# Patient Record
Sex: Male | Born: 1940 | Race: White | Hispanic: No | Marital: Married | Smoking: Former smoker
Health system: Southern US, Community
[De-identification: ages and names within clinical notes are randomized; demographics above are authoritative.]

## PROBLEM LIST (undated history)

## (undated) DIAGNOSIS — K219 Gastro-esophageal reflux disease without esophagitis: Secondary | ICD-10-CM

## (undated) DIAGNOSIS — I10 Essential (primary) hypertension: Secondary | ICD-10-CM

## (undated) DIAGNOSIS — J45909 Unspecified asthma, uncomplicated: Secondary | ICD-10-CM

---

## 1997-10-02 ENCOUNTER — Ambulatory Visit (HOSPITAL_COMMUNITY): Admission: RE | Admit: 1997-10-02 | Discharge: 1997-10-02 | Payer: Self-pay | Admitting: Urology

## 2013-01-30 ENCOUNTER — Emergency Department (HOSPITAL_COMMUNITY): Payer: Medicare Other

## 2013-01-30 ENCOUNTER — Inpatient Hospital Stay (HOSPITAL_COMMUNITY)
Admission: EM | Admit: 2013-01-30 | Discharge: 2013-02-02 | DRG: 086 | Disposition: A | Discharge status: Home / Self Care | Payer: Medicare Other | Attending: General Surgery | Admitting: General Surgery

## 2013-01-30 ENCOUNTER — Encounter (HOSPITAL_COMMUNITY): Payer: Self-pay | Admitting: *Deleted

## 2013-01-30 DIAGNOSIS — I609 Nontraumatic subarachnoid hemorrhage, unspecified: Secondary | ICD-10-CM

## 2013-01-30 DIAGNOSIS — K219 Gastro-esophageal reflux disease without esophagitis: Secondary | ICD-10-CM | POA: Diagnosis present

## 2013-01-30 DIAGNOSIS — R404 Transient alteration of awareness: Secondary | ICD-10-CM | POA: Diagnosis present

## 2013-01-30 DIAGNOSIS — S066X1A Traumatic subarachnoid hemorrhage with loss of consciousness of 30 minutes or less, initial encounter: Secondary | ICD-10-CM

## 2013-01-30 DIAGNOSIS — IMO0002 Reserved for concepts with insufficient information to code with codable children: Secondary | ICD-10-CM | POA: Diagnosis present

## 2013-01-30 DIAGNOSIS — J45909 Unspecified asthma, uncomplicated: Secondary | ICD-10-CM | POA: Diagnosis present

## 2013-01-30 DIAGNOSIS — I10 Essential (primary) hypertension: Secondary | ICD-10-CM | POA: Diagnosis present

## 2013-01-30 DIAGNOSIS — W11XXXA Fall on and from ladder, initial encounter: Secondary | ICD-10-CM

## 2013-01-30 DIAGNOSIS — S066X9A Traumatic subarachnoid hemorrhage with loss of consciousness of unspecified duration, initial encounter: Principal | ICD-10-CM | POA: Diagnosis present

## 2013-01-30 DIAGNOSIS — S2249XA Multiple fractures of ribs, unspecified side, initial encounter for closed fracture: Secondary | ICD-10-CM

## 2013-01-30 DIAGNOSIS — D62 Acute posthemorrhagic anemia: Secondary | ICD-10-CM | POA: Diagnosis not present

## 2013-01-30 DIAGNOSIS — S066XAA Traumatic subarachnoid hemorrhage with loss of consciousness status unknown, initial encounter: Secondary | ICD-10-CM

## 2013-01-30 HISTORY — DX: Essential (primary) hypertension: I10

## 2013-01-30 HISTORY — DX: Gastro-esophageal reflux disease without esophagitis: K21.9

## 2013-01-30 HISTORY — DX: Unspecified asthma, uncomplicated: J45.909

## 2013-01-30 LAB — URINALYSIS, ROUTINE W REFLEX MICROSCOPIC
Glucose, UA: NEGATIVE mg/dL
Leukocytes, UA: NEGATIVE
Nitrite: NEGATIVE
Specific Gravity, Urine: 1.044 — ABNORMAL HIGH (ref 1.005–1.030)
pH: 7.5 (ref 5.0–8.0)

## 2013-01-30 LAB — COMPREHENSIVE METABOLIC PANEL
AST: 37 U/L (ref 0–37)
Albumin: 3.6 g/dL (ref 3.5–5.2)
Chloride: 106 mEq/L (ref 96–112)
Creatinine, Ser: 1.12 mg/dL (ref 0.50–1.35)
Total Bilirubin: 0.3 mg/dL (ref 0.3–1.2)

## 2013-01-30 LAB — CBC WITH DIFFERENTIAL/PLATELET
Lymphocytes Relative: 27 % (ref 12–46)
Lymphs Abs: 2 10*3/uL (ref 0.7–4.0)
MCV: 94.9 fL (ref 78.0–100.0)
Neutro Abs: 4.5 10*3/uL (ref 1.7–7.7)
Neutrophils Relative %: 61 % (ref 43–77)
Platelets: 182 10*3/uL (ref 150–400)
RBC: 4.11 MIL/uL — ABNORMAL LOW (ref 4.22–5.81)
WBC: 7.4 10*3/uL (ref 4.0–10.5)

## 2013-01-30 LAB — LIPASE, BLOOD: Lipase: 57 U/L (ref 11–59)

## 2013-01-30 LAB — SAMPLE TO BLOOD BANK

## 2013-01-30 LAB — MAGNESIUM: Magnesium: 2 mg/dL (ref 1.5–2.5)

## 2013-01-30 LAB — PROTIME-INR: INR: 0.97 (ref 0.00–1.49)

## 2013-01-30 MED ORDER — IPRATROPIUM BROMIDE 0.02 % IN SOLN: 0.5000 mg | RESPIRATORY_TRACT | Status: DC | PRN

## 2013-01-30 MED ORDER — ATENOLOL 50 MG PO TABS
50.0000 mg | ORAL_TABLET | Freq: Every day | ORAL | Status: DC
  Administered 2013-01-31 – 2013-02-02 (×3): 50 mg via ORAL
  Filled 2013-01-30 (×3): qty 1

## 2013-01-30 MED ORDER — IPRATROPIUM BROMIDE 0.02 % IN SOLN
0.5000 mg | Freq: Four times a day (QID) | RESPIRATORY_TRACT | Status: DC
  Filled 2013-01-30: qty 2.5

## 2013-01-30 MED ORDER — DOCUSATE SODIUM 100 MG PO CAPS
100.0000 mg | ORAL_CAPSULE | Freq: Two times a day (BID) | ORAL | Status: DC
  Administered 2013-01-31 – 2013-02-02 (×4): 100 mg via ORAL
  Filled 2013-01-30 (×7): qty 1

## 2013-01-30 MED ORDER — ONDANSETRON HCL 4 MG PO TABS
4.0000 mg | ORAL_TABLET | Freq: Four times a day (QID) | ORAL | Status: DC | PRN
  Administered 2013-01-30: 4 mg via ORAL
  Filled 2013-01-30: qty 1

## 2013-01-30 MED ORDER — ONDANSETRON HCL 4 MG/2ML IJ SOLN: 4.0000 mg | Freq: Four times a day (QID) | INTRAMUSCULAR | Status: DC | PRN

## 2013-01-30 MED ORDER — ALBUTEROL SULFATE (5 MG/ML) 0.5% IN NEBU: 2.5000 mg | INHALATION_SOLUTION | RESPIRATORY_TRACT | Status: DC | PRN

## 2013-01-30 MED ORDER — TRAMADOL HCL 50 MG PO TABS: 50.0000 mg | ORAL_TABLET | Freq: Four times a day (QID) | ORAL | Status: DC | PRN

## 2013-01-30 MED ORDER — PANTOPRAZOLE SODIUM 40 MG IV SOLR
40.0000 mg | Freq: Every day | INTRAVENOUS | Status: DC
  Filled 2013-01-30 (×3): qty 40

## 2013-01-30 MED ORDER — ONDANSETRON HCL 4 MG/2ML IJ SOLN
4.0000 mg | Freq: Four times a day (QID) | INTRAMUSCULAR | Status: DC | PRN
  Administered 2013-02-01: 4 mg via INTRAVENOUS
  Filled 2013-01-30: qty 2

## 2013-01-30 MED ORDER — DIPHENHYDRAMINE HCL 50 MG/ML IJ SOLN: 12.5000 mg | Freq: Four times a day (QID) | INTRAMUSCULAR | Status: DC | PRN

## 2013-01-30 MED ORDER — ACETAMINOPHEN 325 MG PO TABS: 650.0000 mg | ORAL_TABLET | ORAL | Status: DC | PRN

## 2013-01-30 MED ORDER — SODIUM CHLORIDE 0.9 % IJ SOLN: 9.0000 mL | INTRAMUSCULAR | Status: DC | PRN

## 2013-01-30 MED ORDER — ALBUTEROL SULFATE (5 MG/ML) 0.5% IN NEBU
2.5000 mg | INHALATION_SOLUTION | Freq: Four times a day (QID) | RESPIRATORY_TRACT | Status: DC
  Filled 2013-01-30: qty 0.5

## 2013-01-30 MED ORDER — NALOXONE HCL 0.4 MG/ML IJ SOLN: 0.4000 mg | INTRAMUSCULAR | Status: DC | PRN

## 2013-01-30 MED ORDER — SODIUM CHLORIDE 0.9 % IV SOLN
INTRAVENOUS | Status: DC
  Administered 2013-01-30: 1000 mL via INTRAVENOUS
  Administered 2013-01-31: 07:00:00 via INTRAVENOUS

## 2013-01-30 MED ORDER — FENTANYL CITRATE 0.05 MG/ML IJ SOLN
50.0000 ug | Freq: Once | INTRAMUSCULAR | Status: AC
  Administered 2013-01-30: 50 ug via INTRAVENOUS
  Filled 2013-01-30: qty 2

## 2013-01-30 MED ORDER — HYDROMORPHONE 0.3 MG/ML IV SOLN
INTRAVENOUS | Status: DC
  Administered 2013-01-30: 7.5 mg via INTRAVENOUS
  Administered 2013-01-31 (×2): 0.2 mg via INTRAVENOUS
  Filled 2013-01-30: qty 25

## 2013-01-30 MED ORDER — DIPHENHYDRAMINE HCL 12.5 MG/5ML PO ELIX
12.5000 mg | ORAL_SOLUTION | Freq: Four times a day (QID) | ORAL | Status: DC | PRN
  Filled 2013-01-30: qty 5

## 2013-01-30 MED ORDER — IOHEXOL 300 MG/ML  SOLN
100.0000 mL | Freq: Once | INTRAMUSCULAR | Status: AC | PRN
  Administered 2013-01-30: 100 mL via INTRAVENOUS

## 2013-01-30 MED ORDER — PANTOPRAZOLE SODIUM 40 MG PO TBEC
40.0000 mg | DELAYED_RELEASE_TABLET | Freq: Every day | ORAL | Status: DC
  Administered 2013-01-30 – 2013-02-02 (×4): 40 mg via ORAL
  Filled 2013-01-30 (×4): qty 1

## 2013-01-30 MED ORDER — SODIUM CHLORIDE 0.9 % IV BOLUS (SEPSIS)
1000.0000 mL | Freq: Once | INTRAVENOUS | Status: AC
  Administered 2013-01-30: 1000 mL via INTRAVENOUS

## 2013-01-30 NOTE — Consult Note (Signed)
Reason for Consult: Closed head injury status post fall from ladder 12 feet Referring Physician: Trauma M.D.  Raymond Ellison is an 72 y.o. male.  HPI: Patient is a 72 year old individual who has had a fall today from 12 feet on a ladder. He apparently was unconscious at the scene. He broke a number of ribs. When he came to he had no recollection of the event. He has a mild headache. CT scan of his head demonstrates the presence of a small amount of subarachnoid blood in the inferotemporal fissure. The rest of the intracranial contents are normal. There is no evidence of a fracture. CT of the C-spine demonstrates the patient has moderate spondylitic changes throughout the cervical spine. No acute fractures are noted. The alignment is quite anatomic.  Past Medical History  Diagnosis Date  . Hypertension   . GERD (gastroesophageal reflux disease)   . Asthma     History reviewed. No pertinent past surgical history.  History reviewed. No pertinent family history.  Social History:  reports that he has quit smoking. He does not have any smokeless tobacco history on file. He reports that he does not drink alcohol. His drug history is not on file.  Allergies:  Allergies  Allergen Reactions  . Penicillins Nausea And Vomiting    Medications: I have reviewed the patient's current medications.  Results for orders placed during the hospital encounter of 01/30/13 (from the past 48 hour(s))  COMPREHENSIVE METABOLIC PANEL     Status: Abnormal   Collection Time    01/30/13  1:53 PM      Result Value Range   Sodium 142  135 - 145 mEq/L   Potassium 3.9  3.5 - 5.1 mEq/L   Chloride 106  96 - 112 mEq/L   CO2 24  19 - 32 mEq/L   Glucose, Bld 112 (*) 70 - 99 mg/dL   BUN 12  6 - 23 mg/dL   Creatinine, Ser 4.54  0.50 - 1.35 mg/dL   Calcium 9.7  8.4 - 09.8 mg/dL   Total Protein 7.2  6.0 - 8.3 g/dL   Albumin 3.6  3.5 - 5.2 g/dL   AST 37  0 - 37 U/L   ALT 16  0 - 53 U/L   Alkaline Phosphatase 30 (*) 39  - 117 U/L   Total Bilirubin 0.3  0.3 - 1.2 mg/dL   GFR calc non Af Amer 63 (*) >90 mL/min   GFR calc Af Amer 73 (*) >90 mL/min   Comment: (NOTE)     The eGFR has been calculated using the CKD EPI equation.     This calculation has not been validated in all clinical situations.     eGFR's persistently <90 mL/min signify possible Chronic Kidney     Disease.  PROTIME-INR     Status: None   Collection Time    01/30/13  1:53 PM      Result Value Range   Prothrombin Time 12.7  11.6 - 15.2 seconds   INR 0.97  0.00 - 1.49  CBC WITH DIFFERENTIAL     Status: Abnormal   Collection Time    01/30/13  1:53 PM      Result Value Range   WBC 7.4  4.0 - 10.5 K/uL   RBC 4.11 (*) 4.22 - 5.81 MIL/uL   Hemoglobin 14.0  13.0 - 17.0 g/dL   HCT 11.9  14.7 - 82.9 %   MCV 94.9  78.0 - 100.0 fL   MCH  34.1 (*) 26.0 - 34.0 pg   MCHC 35.9  30.0 - 36.0 g/dL   RDW 16.1  09.6 - 04.5 %   Platelets 182  150 - 400 K/uL   Neutrophils Relative % 61  43 - 77 %   Neutro Abs 4.5  1.7 - 7.7 K/uL   Lymphocytes Relative 27  12 - 46 %   Lymphs Abs 2.0  0.7 - 4.0 K/uL   Monocytes Relative 7  3 - 12 %   Monocytes Absolute 0.5  0.1 - 1.0 K/uL   Eosinophils Relative 4  0 - 5 %   Eosinophils Absolute 0.3  0.0 - 0.7 K/uL   Basophils Relative 1  0 - 1 %   Basophils Absolute 0.1  0.0 - 0.1 K/uL  LIPASE, BLOOD     Status: None   Collection Time    01/30/13  1:53 PM      Result Value Range   Lipase 57  11 - 59 U/L  MAGNESIUM     Status: None   Collection Time    01/30/13  1:53 PM      Result Value Range   Magnesium 2.0  1.5 - 2.5 mg/dL  SAMPLE TO BLOOD BANK     Status: None   Collection Time    01/30/13  2:14 PM      Result Value Range   Blood Bank Specimen SAMPLE AVAILABLE FOR TESTING     Sample Expiration 01/31/2013    LACTIC ACID, PLASMA     Status: None   Collection Time    01/30/13  2:14 PM      Result Value Range   Lactic Acid, Venous 1.4  0.5 - 2.2 mmol/L  URINALYSIS, ROUTINE W REFLEX MICROSCOPIC      Status: Abnormal   Collection Time    01/30/13  5:18 PM      Result Value Range   Color, Urine YELLOW  YELLOW   APPearance CLEAR  CLEAR   Specific Gravity, Urine 1.044 (*) 1.005 - 1.030   pH 7.5  5.0 - 8.0   Glucose, UA NEGATIVE  NEGATIVE mg/dL   Hgb urine dipstick MODERATE (*) NEGATIVE   Bilirubin Urine NEGATIVE  NEGATIVE   Ketones, ur NEGATIVE  NEGATIVE mg/dL   Protein, ur 30 (*) NEGATIVE mg/dL   Urobilinogen, UA 0.2  0.0 - 1.0 mg/dL   Nitrite NEGATIVE  NEGATIVE   Leukocytes, UA NEGATIVE  NEGATIVE  URINE MICROSCOPIC-ADD ON     Status: None   Collection Time    01/30/13  5:18 PM      Result Value Range   WBC, UA 0-2  <3 WBC/hpf   RBC / HPF 11-20  <3 RBC/hpf   Bacteria, UA RARE  RARE    Ct Head Wo Contrast  01/30/2013   ADDENDUM REPORT: 01/30/2013 16:00  ADDENDUM: The original report was by Dr. Gaylyn Rong. The following addendum is by Dr. Gaylyn Rong:  I discussed the critical findings in the CT head, cervical spine, chest, abdomen, and pelvis with doctor Alvira Monday at 4 o'clock p.m. on 01/30/2013 by telephone.   Electronically Signed   By: Herbie Baltimore   On: 01/30/2013 16:00   01/30/2013   CLINICAL DATA:  Fall from a ladder, 12 feet, loss of consciousness. Altered mental status.  EXAM: CT HEAD WITHOUT CONTRAST  CT CERVICAL SPINE WITHOUT CONTRAST  TECHNIQUE: Multidetector CT imaging of the head and cervical spine was performed following the standard protocol without intravenous  contrast. Multiplanar CT image reconstructions of the cervical spine were also generated.  COMPARISON:  None.  FINDINGS: CT HEAD FINDINGS  Abnormal linear density along the inferior margin of the right temporal lobe on images 12-13 of series 3 is compatible with subarachnoid hemorrhage likely tracking along the sulcus. The brainstem and cerebellum appear unremarkable. Small hypodensity in the left globus pallidus nucleus may represent dilated perivascular space or a small remote lacunar  infarct.  The ventricular system and basilar cisterns appear normal. No acute CVA or mass lesion.  Chronic maxillary, ethmoid, frontal, and sphenoid sinusitis.  CT CERVICAL SPINE FINDINGS  Cervical spondylosis and degenerative disk disease noted with loss of disc height at C3-4, C5-6, and C6-7, and posterior osseous ridging at all levels between C3 and C7. Intervertebral and facet spurring causes osseous foraminal stenosis on the right at C3-4 and C6-7, and on the left at C3-4, C5-6, and C6-7.  No prevertebral soft tissue swelling is present. A broad central disc protrusion at C2-3 causes borderline central stenosis.  A hypodense right thyroid lobe lesion measures 2.0 x 1.6 cm.  No cervical spine fracture or acute subluxation is identified.  IMPRESSION: CT HEAD IMPRESSION  1. Small amount of acute subarachnoid hemorrhage along the inferior margin of the right temporal lobe. 2. Chronic paranasal sinusitis.  CT CERVICAL SPINE IMPRESSION  1. Cervical spondylosis and degenerative disc disease, causing multilevel foraminal impingement. 2. 2.0 cm right thyroid lobe lesion. Consider further evaluation with thyroid US. If patient is clinically hyperthyroid, consider nuclear medicine thyroid scan. 3. Broad central disc protrusion at C2-3 with borderline central stenosis.  Electronically Signed: By: Herbie Baltimore On: 01/30/2013 15:37   Ct Chest W Contrast  01/30/2013   CLINICAL DATA:  Fall from a ladder, 12 feet with loss of consciousness. Back pain.  EXAM: CT CHEST, ABDOMEN, AND PELVIS WITH CONTRAST  TECHNIQUE: Multidetector CT imaging of the chest, abdomen and pelvis was performed following the standard protocol during bolus administration of intravenous contrast.  CONTRAST:  OMNIPAQUE IOHEXOL 300 MG/ML  SOLN  COMPARISON:  Chest radiograph, 01/30/2013  FINDINGS: CT CHEST FINDINGS  Precarinal lymph node short axis 0.8 cm. Coronary artery atherosclerosis. Small to moderate-sized hiatal hernia. No significant  pleural effusion.  Multiple bilateral rib fractures are present. These include the right 6th, 7th, and 8th ribs anterolaterally; the left 4th, 5th, 6th, 7th, 8th, and 9th ribs anterolaterally; and the left 7th, 8th, and 9th ribs posteromedially, with the left 9th posteromedial rib fracture displaced. This makes the left 7th, 8th, and 9th rib fractures segmental.  Dependent subsegmental atelectasis noted in both lower lobes. No pneumothorax observed. There is a 4 mm nodule along the minor fissure.  CT ABDOMEN AND PELVIS FINDINGS  Scattered fluid density lesions in the liver favor cysts although some are too small to characterize. The largest measures 1.7 cm in diameter in the right hepatic lobe on image 60 of series 4. No hepatic laceration or splenic laceration identified.  Adrenal glands and pancreas within normal limits. Gallbladder appears normal.  Right renal cysts noted. Several small lower pole cysts are present on the right.  There is hematoma overlying the right iliac bone in the subcutaneous tissues post for laterally, without overt hematoma in the gluteal musculature. There is a mixed density in the vicinity of the hematoma on image 86 of series 4, and a trace amount of subcutaneous active bleeding could not be readily excluded although the stranding in this vicinity is primarily infiltrative rather than  masslike.  I do not see a lumbar spine fracture. Anterior epidural prominence in the lower lumbar spine is probably due to degenerative disc disease and venous plexus enhancement, although if the patient has significant neurologic abnormalities in the lower extremities then lumbar MRI may be warranted to exclude the unlikely possibility of epidural hematoma.  No pelvic fracture observed. Urinary bladder unremarkable.  IMPRESSION: CT CHEST IMPRESSION  1. Fractures of the right 6th, 7th, and 8th ribs. Fractures of the left 4th, 5th, and 6th ribs. Segmental fractures of the left 7th, 8th, and 9th ribs. 2. 4  mm nodule along the minor fissure. If the patient is at high risk for bronchogenic carcinoma, follow-up chest CT at 1year is recommended. If the patient is at low risk, no follow-up is needed. This recommendation follows the consensus statement: Guidelines for Management of Small Pulmonary Nodules Detected on CT Scans: A Statement from the Fleischner Society as published in Radiology 2005; 237:395-400. 3. Small to moderate-sized hiatal hernia. 4. Coronary artery atherosclerosis  CT ABDOMEN AND PELVIS IMPRESSION  1. Subcutaneous hematoma projecting superficial to the right iliac bone and gluteus musculature. There may be a tiny amount of active subcutaneous bleeding during the scan, but the hematoma in this vicinity is primarily infiltrative rather than masslike. 2. Hepatic and renal cysts. 3. In anterior epidural prominence in the lower lumbar spine is likely due to venous plexus enhancement. If the patient has significant neurologic impairment in the lower extremities than lumbar MRI would be recommended to exclude the unlikely possibility of epidural hematoma.   Electronically Signed   By: Herbie Baltimore   On: 01/30/2013 15:52   Ct Cervical Spine Wo Contrast  01/30/2013   ADDENDUM REPORT: 01/30/2013 16:00  ADDENDUM: The original report was by Dr. Gaylyn Rong. The following addendum is by Dr. Gaylyn Rong:  I discussed the critical findings in the CT head, cervical spine, chest, abdomen, and pelvis with doctor Alvira Monday at 4 o'clock p.m. on 01/30/2013 by telephone.   Electronically Signed   By: Herbie Baltimore   On: 01/30/2013 16:00   01/30/2013   CLINICAL DATA:  Fall from a ladder, 12 feet, loss of consciousness. Altered mental status.  EXAM: CT HEAD WITHOUT CONTRAST  CT CERVICAL SPINE WITHOUT CONTRAST  TECHNIQUE: Multidetector CT imaging of the head and cervical spine was performed following the standard protocol without intravenous contrast. Multiplanar CT image reconstructions of the  cervical spine were also generated.  COMPARISON:  None.  FINDINGS: CT HEAD FINDINGS  Abnormal linear density along the inferior margin of the right temporal lobe on images 12-13 of series 3 is compatible with subarachnoid hemorrhage likely tracking along the sulcus. The brainstem and cerebellum appear unremarkable. Small hypodensity in the left globus pallidus nucleus may represent dilated perivascular space or a small remote lacunar infarct.  The ventricular system and basilar cisterns appear normal. No acute CVA or mass lesion.  Chronic maxillary, ethmoid, frontal, and sphenoid sinusitis.  CT CERVICAL SPINE FINDINGS  Cervical spondylosis and degenerative disk disease noted with loss of disc height at C3-4, C5-6, and C6-7, and posterior osseous ridging at all levels between C3 and C7. Intervertebral and facet spurring causes osseous foraminal stenosis on the right at C3-4 and C6-7, and on the left at C3-4, C5-6, and C6-7.  No prevertebral soft tissue swelling is present. A broad central disc protrusion at C2-3 causes borderline central stenosis.  A hypodense right thyroid lobe lesion measures 2.0 x 1.6 cm.  No cervical spine  fracture or acute subluxation is identified.  IMPRESSION: CT HEAD IMPRESSION  1. Small amount of acute subarachnoid hemorrhage along the inferior margin of the right temporal lobe. 2. Chronic paranasal sinusitis.  CT CERVICAL SPINE IMPRESSION  1. Cervical spondylosis and degenerative disc disease, causing multilevel foraminal impingement. 2. 2.0 cm right thyroid lobe lesion. Consider further evaluation with thyroid US. If patient is clinically hyperthyroid, consider nuclear medicine thyroid scan. 3. Broad central disc protrusion at C2-3 with borderline central stenosis.  Electronically Signed: By: Herbie Baltimore On: 01/30/2013 15:37   Ct Abdomen Pelvis W Contrast  01/30/2013   CLINICAL DATA:  Fall from a ladder, 12 feet with loss of consciousness. Back pain.  EXAM: CT CHEST, ABDOMEN, AND  PELVIS WITH CONTRAST  TECHNIQUE: Multidetector CT imaging of the chest, abdomen and pelvis was performed following the standard protocol during bolus administration of intravenous contrast.  CONTRAST:  OMNIPAQUE IOHEXOL 300 MG/ML  SOLN  COMPARISON:  Chest radiograph, 01/30/2013  FINDINGS: CT CHEST FINDINGS  Precarinal lymph node short axis 0.8 cm. Coronary artery atherosclerosis. Small to moderate-sized hiatal hernia. No significant pleural effusion.  Multiple bilateral rib fractures are present. These include the right 6th, 7th, and 8th ribs anterolaterally; the left 4th, 5th, 6th, 7th, 8th, and 9th ribs anterolaterally; and the left 7th, 8th, and 9th ribs posteromedially, with the left 9th posteromedial rib fracture displaced. This makes the left 7th, 8th, and 9th rib fractures segmental.  Dependent subsegmental atelectasis noted in both lower lobes. No pneumothorax observed. There is a 4 mm nodule along the minor fissure.  CT ABDOMEN AND PELVIS FINDINGS  Scattered fluid density lesions in the liver favor cysts although some are too small to characterize. The largest measures 1.7 cm in diameter in the right hepatic lobe on image 60 of series 4. No hepatic laceration or splenic laceration identified.  Adrenal glands and pancreas within normal limits. Gallbladder appears normal.  Right renal cysts noted. Several small lower pole cysts are present on the right.  There is hematoma overlying the right iliac bone in the subcutaneous tissues post for laterally, without overt hematoma in the gluteal musculature. There is a mixed density in the vicinity of the hematoma on image 86 of series 4, and a trace amount of subcutaneous active bleeding could not be readily excluded although the stranding in this vicinity is primarily infiltrative rather than masslike.  I do not see a lumbar spine fracture. Anterior epidural prominence in the lower lumbar spine is probably due to degenerative disc disease and venous plexus  enhancement, although if the patient has significant neurologic abnormalities in the lower extremities then lumbar MRI may be warranted to exclude the unlikely possibility of epidural hematoma.  No pelvic fracture observed. Urinary bladder unremarkable.  IMPRESSION: CT CHEST IMPRESSION  1. Fractures of the right 6th, 7th, and 8th ribs. Fractures of the left 4th, 5th, and 6th ribs. Segmental fractures of the left 7th, 8th, and 9th ribs. 2. 4 mm nodule along the minor fissure. If the patient is at high risk for bronchogenic carcinoma, follow-up chest CT at 1year is recommended. If the patient is at low risk, no follow-up is needed. This recommendation follows the consensus statement: Guidelines for Management of Small Pulmonary Nodules Detected on CT Scans: A Statement from the Fleischner Society as published in Radiology 2005; 237:395-400. 3. Small to moderate-sized hiatal hernia. 4. Coronary artery atherosclerosis  CT ABDOMEN AND PELVIS IMPRESSION  1. Subcutaneous hematoma projecting superficial to the right iliac bone  and gluteus musculature. There may be a tiny amount of active subcutaneous bleeding during the scan, but the hematoma in this vicinity is primarily infiltrative rather than masslike. 2. Hepatic and renal cysts. 3. In anterior epidural prominence in the lower lumbar spine is likely due to venous plexus enhancement. If the patient has significant neurologic impairment in the lower extremities than lumbar MRI would be recommended to exclude the unlikely possibility of epidural hematoma.   Electronically Signed   By: Herbie Baltimore   On: 01/30/2013 15:52   Dg Chest Portable 1 View  01/30/2013   CLINICAL DATA:  Fall from ladder.  EXAM: PORTABLE CHEST - 1 VIEW  COMPARISON:  None.  FINDINGS: There is a left lateral 7th rib fracture. Right lateral 6th rib fracture noted. No pneumothorax. Heart is normal size. Lungs are clear. No effusions.  IMPRESSION: Right 6th and left 7th rib fractures. No visible  pneumothorax.   Electronically Signed   By: Charlett Nose   On: 01/30/2013 14:05    Review of Systems  Constitutional: Negative.   HENT: Negative.   Eyes: Negative.   Respiratory:       Pain on deep inhalation  Cardiovascular: Negative.   Gastrointestinal: Negative.   Genitourinary: Negative.   Musculoskeletal:       Pain in region of chest  Skin: Negative.   Neurological: Negative.   Endo/Heme/Allergies: Negative.   Psychiatric/Behavioral: Negative.    Blood pressure 178/85, pulse 83, temperature 99.3 F (37.4 C), temperature source Oral, resp. rate 22, height 5\' 6"  (1.676 m), weight 78.472 kg (173 lb), SpO2 96.00%. Physical Exam  Constitutional: He is oriented to person, place, and time. He appears well-developed and well-nourished.  HENT:  Head: Normocephalic and atraumatic.  Eyes: Conjunctivae and EOM are normal. Pupils are equal, round, and reactive to light.  Neck: Normal range of motion. Neck supple.  Cardiovascular: Normal rate and regular rhythm.   Respiratory: Effort normal and breath sounds normal.  GI: Soft. Bowel sounds are normal.  Musculoskeletal:  No tenderness in the region of the neck range of motion is normal  Neurological: He is alert and oriented to person, place, and time. He has normal reflexes.  No evidence of a drift motor function is normal finger-nose-finger is normal. Neck is supple  Skin: Skin is warm and dry.  Psychiatric: He has a normal mood and affect. His behavior is normal. Judgment and thought content normal.    Assessment/Plan: Mild close head injury. Status post fall from ladder 12 feet with loss of consciousness.  Patient will do well with simple observation. May be discharged tomorrow if scan is stable.  Raymond Ellison 01/30/2013, 7:02 PM

## 2013-01-30 NOTE — ED Provider Notes (Signed)
I saw and evaluated the patient, reviewed the resident's note and I agree with the findings and plan.  72 year old male presenting after a fall from approximately 12 feet off a ladder. Positive loss of consciousness. Repetitive questioning. Neurological examination otherwise nonfocal. No use of blood thinning medication aside from a baby aspirin. Patient is complaining of pain primarily in his mid back. No numbness, tingling or focal loss of strength. No acute visual changes. No nausea or vomiting. Workup significant for a small subarachnoid hemorrhage and  numerous bilateral rib fractures. Through out his ED stay he has progressively become more lucid and denies any new complaints.Will consult neurosurgery and trauma.    CRITICAL CARE Performed by: Raeford Razor Total critical care time:  30 minutesCritical care time was exclusive of separately billable procedures and treating other patients. Critical care was necessary to treat or prevent imminent or life-threatening deterioration. Critical care was time spent personally by me on the following activities: development of treatment plan with patient and/or surrogate as well as nursing, discussions with consultants, evaluation of patient's response to treatment, examination of patient, obtaining history from patient or surrogate, ordering and performing treatments and interventions, ordering and review of laboratory studies, ordering and review of radiographic studies, pulse oximetry and re-evaluation of patient's condition.   Raeford Razor, MD 01/30/13 1620

## 2013-01-30 NOTE — ED Notes (Signed)
Dr. Janee Morn, Trauma surgeon, at the bedside.

## 2013-01-30 NOTE — Progress Notes (Signed)
Responded to level 2 trauma fall from ladder.  Pt was cutting tree and fell hitting head and expereincing some confusion. Wife at bedside. Provided emotional support pt. And family Pt. Going to CT for scan.  Will follow as needed.  01/30/13 1300  Clinical Encounter Type  Visited With Patient;Family;Health care provider  Visit Type Spiritual support;ED;Trauma  Referral From Nurse  Spiritual Encounters  Spiritual Needs Emotional  Stress Factors  Patient Stress Factors None identified  Family Stress Factors None identified  Venida Jarvis [place], [phone]

## 2013-01-30 NOTE — ED Provider Notes (Signed)
CSN: [id number]     Arrival date & time 01/30/13  1336 History   First MD Initiated Contact with Patient 01/30/13 1351     Chief Complaint  Patient presents with  . Fall   (Consider location/radiation/quality/duration/timing/severity/associated sxs/prior Treatment) HPI 72 year old male history of hypertension presents with fall from 12 foot ladder with loss of consciousness as a level II trauma. Patient does not remember how he fell from the ladder, and episode was unwitnessed, however wife notes she was there shortly after and the patient was unresponsive. On EMS arrival patient had repetitive questioning. Patient's only concern is pain in his lower back which is mild.  Patient denies any preceding chest pain or shortness of breath, or current chest pain, abdominal pain, nausea, extremity pain, headache, neck pain, numbness tingling or weakness.  Patient reports feeling sore in his back like he "had a big workout at the gym."  Past Medical History  Diagnosis Date  . Hypertension   . GERD (gastroesophageal reflux disease)   . Asthma    History reviewed. No pertinent past surgical history. History reviewed. No pertinent family history. History  Substance Use Topics  . Smoking status: Not on file  . Smokeless tobacco: Not on file  . Alcohol Use: No    Review of Systems  Constitutional: Negative for fever.  HENT: Negative for sore throat and neck stiffness.   Eyes: Negative for visual disturbance.  Respiratory: Negative for shortness of breath.   Cardiovascular: Negative for chest pain.  Gastrointestinal: Negative for abdominal pain.  Genitourinary: Negative for difficulty urinating.  Musculoskeletal: Positive for back pain.  Skin: Negative for rash.  Neurological: Negative for syncope and headaches.    Allergies  Penicillins  Home Medications   Current Outpatient Rx  Name  Route  Sig  Dispense  Refill  . aspirin 81 MG tablet   Oral   Take 81 mg by mouth daily.          Marland Kitchen atenolol (TENORMIN) 50 MG tablet   Oral   Take 50 mg by mouth daily.         . fish oil-omega-3 fatty acids 1000 MG capsule   Oral   Take 2 g by mouth daily.         Marland Kitchen omeprazole (PRILOSEC) 40 MG capsule   Oral   Take 40 mg by mouth daily.          BP 151/73  Pulse 87  Temp(Src) 97.9 F (36.6 C) (Oral)  Resp 14  Ht 5\' 6"  (1.676 m)  Wt 173 lb (78.472 kg)  BMI 27.94 kg/m2  SpO2 98% Physical Exam  Nursing note and vitals reviewed. Constitutional: He is oriented to person, place, and time. He appears well-developed and well-nourished. No distress.  Asking repetitive questions  HENT:  Head: Normocephalic and atraumatic. Head is without raccoon's eyes and without laceration.  Nose: No nasal septal hematoma.  Mouth/Throat: Oropharynx is clear and moist. No oropharyngeal exudate.  Eyes: Conjunctivae and EOM are normal. Pupils are equal, round, and reactive to light.  Neck: Normal range of motion.  Cardiovascular: Normal rate, regular rhythm, normal heart sounds and intact distal pulses.  Exam reveals no gallop and no friction rub.   No murmur heard. Pulmonary/Chest: Effort normal and breath sounds normal. No respiratory distress. He has no wheezes. He has no rales.  Abdominal: Soft. He exhibits no distension. There is no tenderness. There is no guarding.  Musculoskeletal: He exhibits no edema.  Right hip: He exhibits no bony tenderness.       Left hip: He exhibits no bony tenderness.       Cervical back: Normal. He exhibits no bony tenderness.       Thoracic back: He exhibits no bony tenderness.       Lumbar back: He exhibits no bony tenderness.  Neurological: He is alert and oriented to person, place, and time. He has normal strength. No sensory deficit. GCS eye subscore is 4. GCS verbal subscore is 5. GCS motor subscore is 6.  Skin: Skin is warm and dry. No laceration, no lesion and no rash noted. He is not diaphoretic.    ED Course  Procedures (including  critical care time) Labs Review Labs Reviewed  COMPREHENSIVE METABOLIC PANEL - Abnormal; Notable for the following:    Glucose, Bld 112 (*)    Alkaline Phosphatase 30 (*)    GFR calc non Af Amer 63 (*)    GFR calc Af Amer 73 (*)    All other components within normal limits  CBC WITH DIFFERENTIAL - Abnormal; Notable for the following:    RBC 4.11 (*)    MCH 34.1 (*)    All other components within normal limits  PROTIME-INR  LIPASE, BLOOD  LACTIC ACID, PLASMA  MAGNESIUM  URINALYSIS, ROUTINE W REFLEX MICROSCOPIC  SAMPLE TO BLOOD BANK   Imaging Review Ct Head Wo Contrast  01/30/2013   ADDENDUM REPORT: 01/30/2013 16:00  ADDENDUM: The original report was by Dr. Gaylyn Rong. The following addendum is by Dr. Gaylyn Rong:  I discussed the critical findings in the CT head, cervical spine, chest, abdomen, and pelvis with doctor Alvira Monday at 4 o'clock p.m. on 01/30/2013 by telephone.   Electronically Signed   By: Herbie Baltimore   On: 01/30/2013 16:00   01/30/2013   CLINICAL DATA:  Fall from a ladder, 12 feet, loss of consciousness. Altered mental status.  EXAM: CT HEAD WITHOUT CONTRAST  CT CERVICAL SPINE WITHOUT CONTRAST  TECHNIQUE: Multidetector CT imaging of the head and cervical spine was performed following the standard protocol without intravenous contrast. Multiplanar CT image reconstructions of the cervical spine were also generated.  COMPARISON:  None.  FINDINGS: CT HEAD FINDINGS  Abnormal linear density along the inferior margin of the right temporal lobe on images 12-13 of series 3 is compatible with subarachnoid hemorrhage likely tracking along the sulcus. The brainstem and cerebellum appear unremarkable. Small hypodensity in the left globus pallidus nucleus may represent dilated perivascular space or a small remote lacunar infarct.  The ventricular system and basilar cisterns appear normal. No acute CVA or mass lesion.  Chronic maxillary, ethmoid, frontal, and sphenoid  sinusitis.  CT CERVICAL SPINE FINDINGS  Cervical spondylosis and degenerative disk disease noted with loss of disc height at C3-4, C5-6, and C6-7, and posterior osseous ridging at all levels between C3 and C7. Intervertebral and facet spurring causes osseous foraminal stenosis on the right at C3-4 and C6-7, and on the left at C3-4, C5-6, and C6-7.  No prevertebral soft tissue swelling is present. A broad central disc protrusion at C2-3 causes borderline central stenosis.  A hypodense right thyroid lobe lesion measures 2.0 x 1.6 cm.  No cervical spine fracture or acute subluxation is identified.  IMPRESSION: CT HEAD IMPRESSION  1. Small amount of acute subarachnoid hemorrhage along the inferior margin of the right temporal lobe. 2. Chronic paranasal sinusitis.  CT CERVICAL SPINE IMPRESSION  1. Cervical spondylosis and degenerative disc disease, causing  multilevel foraminal impingement. 2. 2.0 cm right thyroid lobe lesion. Consider further evaluation with thyroid US. If patient is clinically hyperthyroid, consider nuclear medicine thyroid scan. 3. Broad central disc protrusion at C2-3 with borderline central stenosis.  Electronically Signed: By: Herbie Baltimore On: 01/30/2013 15:37   Ct Chest W Contrast  01/30/2013   CLINICAL DATA:  Fall from a ladder, 12 feet with loss of consciousness. Back pain.  EXAM: CT CHEST, ABDOMEN, AND PELVIS WITH CONTRAST  TECHNIQUE: Multidetector CT imaging of the chest, abdomen and pelvis was performed following the standard protocol during bolus administration of intravenous contrast.  CONTRAST:  OMNIPAQUE IOHEXOL 300 MG/ML  SOLN  COMPARISON:  Chest radiograph, 01/30/2013  FINDINGS: CT CHEST FINDINGS  Precarinal lymph node short axis 0.8 cm. Coronary artery atherosclerosis. Small to moderate-sized hiatal hernia. No significant pleural effusion.  Multiple bilateral rib fractures are present. These include the right 6th, 7th, and 8th ribs anterolaterally; the left 4th, 5th, 6th,  7th, 8th, and 9th ribs anterolaterally; and the left 7th, 8th, and 9th ribs posteromedially, with the left 9th posteromedial rib fracture displaced. This makes the left 7th, 8th, and 9th rib fractures segmental.  Dependent subsegmental atelectasis noted in both lower lobes. No pneumothorax observed. There is a 4 mm nodule along the minor fissure.  CT ABDOMEN AND PELVIS FINDINGS  Scattered fluid density lesions in the liver favor cysts although some are too small to characterize. The largest measures 1.7 cm in diameter in the right hepatic lobe on image 60 of series 4. No hepatic laceration or splenic laceration identified.  Adrenal glands and pancreas within normal limits. Gallbladder appears normal.  Right renal cysts noted. Several small lower pole cysts are present on the right.  There is hematoma overlying the right iliac bone in the subcutaneous tissues post for laterally, without overt hematoma in the gluteal musculature. There is a mixed density in the vicinity of the hematoma on image 86 of series 4, and a trace amount of subcutaneous active bleeding could not be readily excluded although the stranding in this vicinity is primarily infiltrative rather than masslike.  I do not see a lumbar spine fracture. Anterior epidural prominence in the lower lumbar spine is probably due to degenerative disc disease and venous plexus enhancement, although if the patient has significant neurologic abnormalities in the lower extremities then lumbar MRI may be warranted to exclude the unlikely possibility of epidural hematoma.  No pelvic fracture observed. Urinary bladder unremarkable.  IMPRESSION: CT CHEST IMPRESSION  1. Fractures of the right 6th, 7th, and 8th ribs. Fractures of the left 4th, 5th, and 6th ribs. Segmental fractures of the left 7th, 8th, and 9th ribs. 2. 4 mm nodule along the minor fissure. If the patient is at high risk for bronchogenic carcinoma, follow-up chest CT at 1year is recommended. If the patient  is at low risk, no follow-up is needed. This recommendation follows the consensus statement: Guidelines for Management of Small Pulmonary Nodules Detected on CT Scans: A Statement from the Fleischner Society as published in Radiology 2005; 237:395-400. 3. Small to moderate-sized hiatal hernia. 4. Coronary artery atherosclerosis  CT ABDOMEN AND PELVIS IMPRESSION  1. Subcutaneous hematoma projecting superficial to the right iliac bone and gluteus musculature. There may be a tiny amount of active subcutaneous bleeding during the scan, but the hematoma in this vicinity is primarily infiltrative rather than masslike. 2. Hepatic and renal cysts. 3. In anterior epidural prominence in the lower lumbar spine is likely due to  venous plexus enhancement. If the patient has significant neurologic impairment in the lower extremities than lumbar MRI would be recommended to exclude the unlikely possibility of epidural hematoma.   Electronically Signed   By: Herbie Baltimore   On: 01/30/2013 15:52   Ct Cervical Spine Wo Contrast  01/30/2013   ADDENDUM REPORT: 01/30/2013 16:00  ADDENDUM: The original report was by Dr. Gaylyn Rong. The following addendum is by Dr. Gaylyn Rong:  I discussed the critical findings in the CT head, cervical spine, chest, abdomen, and pelvis with doctor Alvira Monday at 4 o'clock p.m. on 01/30/2013 by telephone.   Electronically Signed   By: Herbie Baltimore   On: 01/30/2013 16:00   01/30/2013   CLINICAL DATA:  Fall from a ladder, 12 feet, loss of consciousness. Altered mental status.  EXAM: CT HEAD WITHOUT CONTRAST  CT CERVICAL SPINE WITHOUT CONTRAST  TECHNIQUE: Multidetector CT imaging of the head and cervical spine was performed following the standard protocol without intravenous contrast. Multiplanar CT image reconstructions of the cervical spine were also generated.  COMPARISON:  None.  FINDINGS: CT HEAD FINDINGS  Abnormal linear density along the inferior margin of the right temporal  lobe on images 12-13 of series 3 is compatible with subarachnoid hemorrhage likely tracking along the sulcus. The brainstem and cerebellum appear unremarkable. Small hypodensity in the left globus pallidus nucleus may represent dilated perivascular space or a small remote lacunar infarct.  The ventricular system and basilar cisterns appear normal. No acute CVA or mass lesion.  Chronic maxillary, ethmoid, frontal, and sphenoid sinusitis.  CT CERVICAL SPINE FINDINGS  Cervical spondylosis and degenerative disk disease noted with loss of disc height at C3-4, C5-6, and C6-7, and posterior osseous ridging at all levels between C3 and C7. Intervertebral and facet spurring causes osseous foraminal stenosis on the right at C3-4 and C6-7, and on the left at C3-4, C5-6, and C6-7.  No prevertebral soft tissue swelling is present. A broad central disc protrusion at C2-3 causes borderline central stenosis.  A hypodense right thyroid lobe lesion measures 2.0 x 1.6 cm.  No cervical spine fracture or acute subluxation is identified.  IMPRESSION: CT HEAD IMPRESSION  1. Small amount of acute subarachnoid hemorrhage along the inferior margin of the right temporal lobe. 2. Chronic paranasal sinusitis.  CT CERVICAL SPINE IMPRESSION  1. Cervical spondylosis and degenerative disc disease, causing multilevel foraminal impingement. 2. 2.0 cm right thyroid lobe lesion. Consider further evaluation with thyroid US. If patient is clinically hyperthyroid, consider nuclear medicine thyroid scan. 3. Broad central disc protrusion at C2-3 with borderline central stenosis.  Electronically Signed: By: Herbie Baltimore On: 01/30/2013 15:37   Ct Abdomen Pelvis W Contrast  01/30/2013   CLINICAL DATA:  Fall from a ladder, 12 feet with loss of consciousness. Back pain.  EXAM: CT CHEST, ABDOMEN, AND PELVIS WITH CONTRAST  TECHNIQUE: Multidetector CT imaging of the chest, abdomen and pelvis was performed following the standard protocol during bolus  administration of intravenous contrast.  CONTRAST:  OMNIPAQUE IOHEXOL 300 MG/ML  SOLN  COMPARISON:  Chest radiograph, 01/30/2013  FINDINGS: CT CHEST FINDINGS  Precarinal lymph node short axis 0.8 cm. Coronary artery atherosclerosis. Small to moderate-sized hiatal hernia. No significant pleural effusion.  Multiple bilateral rib fractures are present. These include the right 6th, 7th, and 8th ribs anterolaterally; the left 4th, 5th, 6th, 7th, 8th, and 9th ribs anterolaterally; and the left 7th, 8th, and 9th ribs posteromedially, with the left 9th posteromedial rib fracture displaced.  This makes the left 7th, 8th, and 9th rib fractures segmental.  Dependent subsegmental atelectasis noted in both lower lobes. No pneumothorax observed. There is a 4 mm nodule along the minor fissure.  CT ABDOMEN AND PELVIS FINDINGS  Scattered fluid density lesions in the liver favor cysts although some are too small to characterize. The largest measures 1.7 cm in diameter in the right hepatic lobe on image 60 of series 4. No hepatic laceration or splenic laceration identified.  Adrenal glands and pancreas within normal limits. Gallbladder appears normal.  Right renal cysts noted. Several small lower pole cysts are present on the right.  There is hematoma overlying the right iliac bone in the subcutaneous tissues post for laterally, without overt hematoma in the gluteal musculature. There is a mixed density in the vicinity of the hematoma on image 86 of series 4, and a trace amount of subcutaneous active bleeding could not be readily excluded although the stranding in this vicinity is primarily infiltrative rather than masslike.  I do not see a lumbar spine fracture. Anterior epidural prominence in the lower lumbar spine is probably due to degenerative disc disease and venous plexus enhancement, although if the patient has significant neurologic abnormalities in the lower extremities then lumbar MRI may be warranted to exclude the  unlikely possibility of epidural hematoma.  No pelvic fracture observed. Urinary bladder unremarkable.  IMPRESSION: CT CHEST IMPRESSION  1. Fractures of the right 6th, 7th, and 8th ribs. Fractures of the left 4th, 5th, and 6th ribs. Segmental fractures of the left 7th, 8th, and 9th ribs. 2. 4 mm nodule along the minor fissure. If the patient is at high risk for bronchogenic carcinoma, follow-up chest CT at 1year is recommended. If the patient is at low risk, no follow-up is needed. This recommendation follows the consensus statement: Guidelines for Management of Small Pulmonary Nodules Detected on CT Scans: A Statement from the Fleischner Society as published in Radiology 2005; 237:395-400. 3. Small to moderate-sized hiatal hernia. 4. Coronary artery atherosclerosis  CT ABDOMEN AND PELVIS IMPRESSION  1. Subcutaneous hematoma projecting superficial to the right iliac bone and gluteus musculature. There may be a tiny amount of active subcutaneous bleeding during the scan, but the hematoma in this vicinity is primarily infiltrative rather than masslike. 2. Hepatic and renal cysts. 3. In anterior epidural prominence in the lower lumbar spine is likely due to venous plexus enhancement. If the patient has significant neurologic impairment in the lower extremities than lumbar MRI would be recommended to exclude the unlikely possibility of epidural hematoma.   Electronically Signed   By: Herbie Baltimore   On: 01/30/2013 15:52   Dg Chest Portable 1 View  01/30/2013   CLINICAL DATA:  Fall from ladder.  EXAM: PORTABLE CHEST - 1 VIEW  COMPARISON:  None.  FINDINGS: There is a left lateral 7th rib fracture. Right lateral 6th rib fracture noted. No pneumothorax. Heart is normal size. Lungs are clear. No effusions.  IMPRESSION: Right 6th and left 7th rib fractures. No visible pneumothorax.   Electronically Signed   By: Charlett Nose   On: 01/30/2013 14:05   Date: 01/30/2013  Rate: 81  Rhythm: normal sinus rhythm  QRS Axis:  normal  Intervals: normal  ST/T Wave abnormalities: normal  Conduction Disutrbances:none  Narrative Interpretation:   Old EKG Reviewed: none available    MDM   1. Fall from ladder, initial encounter   2. Multiple rib fractures, unspecified laterality, closed, initial encounter, bilateral   3. Subarachnoid  hemorrhage    72 year old male history of hypertension presents with fall from 12 foot ladder with loss of consciousness as a level II trauma.  Patient arrived with GCS of 14-15, appropriately answering questions however asking repetitive questions, stable vital signs and unremarkable primary survey.  Secondary survey showed normal strength and sensation in extremities, no chest, abdominal or pelvic tenderness, however unclear how reliable patient was given repetitive questioning and LOC.  Patient with unclear fall with LOC and amnesia to the event versus less likely syncope and fall, however patient does not remember how he fell. EKG evaluated by me and showed no signs of ischemia, prolonged QTC, Brugada, delta waves as cause of syncope.  Labs overall unremarkable. CT chest is significant for 3 right rib fractures, and fractures of the left fourth through ninth ribs with segmental fractures of the left seventh through ninth.  CT abdomen showed subcutaneous hematoma superficial to right iliac bone, anterior epidural prominence in lower lumbar spine thought to be venous plexus enhancement.  Patient without neurologic findings in lower extremities.  CT cervical spine revealed no acute traumatic findings. CT head was significant for a small moderate acute subarachnoid hemorrhage along the inferior margin of the right temporal lobe. Neurosurgery and trauma were consulted.  Patient to be admitted to trauma service.    Rhae Lerner, MD 01/30/13 [phone]

## 2013-01-30 NOTE — ED Notes (Signed)
Pt arrived as level 2 for fall off ladder 12 ft. +loc, having repetitive questions pta.

## 2013-01-30 NOTE — H&P (Signed)
Raymond Ellison is an 72 y.o. male.   Chief Complaint: Bilateral rib pain after fall from ladder HPI: Patient was up on a 12 foot ladder trimming limbs in his backyard. The ladder became unstable and he fell. This was witnessed by his wife. He had a brief loss of consciousness of a couple minutes. He is amnestic to the event. He was evaluated in the emergency department as a level II trauma. 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 We are asked to see him for admission to the trauma service.His wife assisted with history.  Past Medical History  Diagnosis Date  . Hypertension   . GERD (gastroesophageal reflux disease)   . Asthma     History reviewed. No pertinent past surgical history.  History reviewed. No pertinent family history. Social History:  reports that he does not drink alcohol. His tobacco and drug histories are not on file.  Allergies:  Allergies  Allergen Reactions  . Penicillins Nausea And Vomiting     (Not in a hospital admission)  Results for orders placed during the hospital encounter of 01/30/13 (from the past 48 hour(s))  COMPREHENSIVE METABOLIC PANEL     Status: Abnormal   Collection Time    01/30/13  1:53 PM      Result Value Range   Sodium 142  135 - 145 mEq/L   Potassium 3.9  3.5 - 5.1 mEq/L   Chloride 106  96 - 112 mEq/L   CO2 24  19 - 32 mEq/L   Glucose, Bld 112 (*) 70 - 99 mg/dL   BUN 12  6 - 23 mg/dL   Creatinine, Ser 1.61  0.50 - 1.35 mg/dL   Calcium 9.7  8.4 - 09.6 mg/dL   Total Protein 7.2  6.0 - 8.3 g/dL   Albumin 3.6  3.5 - 5.2 g/dL   AST 37  0 - 37 U/L   ALT 16  0 - 53 U/L   Alkaline Phosphatase 30 (*) 39 - 117 U/L   Total Bilirubin 0.3  0.3 - 1.2 mg/dL   GFR calc non Af Amer 63 (*) >90 mL/min   GFR calc Af Amer 73 (*) >90 mL/min   Comment: (NOTE)     The eGFR has been calculated using the CKD EPI equation.     This calculation has not been validated in all clinical situations.     eGFR's persistently <90  mL/min signify possible Chronic Kidney     Disease.  PROTIME-INR     Status: None   Collection Time    01/30/13  1:53 PM      Result Value Range   Prothrombin Time 12.7  11.6 - 15.2 seconds   INR 0.97  0.00 - 1.49  CBC WITH DIFFERENTIAL     Status: Abnormal   Collection Time    01/30/13  1:53 PM      Result Value Range   WBC 7.4  4.0 - 10.5 K/uL   RBC 4.11 (*) 4.22 - 5.81 MIL/uL   Hemoglobin 14.0  13.0 - 17.0 g/dL   HCT 04.5  40.9 - 81.1 %   MCV 94.9  78.0 - 100.0 fL   MCH 34.1 (*) 26.0 - 34.0 pg   MCHC 35.9  30.0 - 36.0 g/dL   RDW 91.4  78.2 - 95.6 %   Platelets 182  150 - 400 K/uL   Neutrophils Relative % 61  43 - 77 %   Neutro Abs 4.5  1.7 -  7.7 K/uL   Lymphocytes Relative 27  12 - 46 %   Lymphs Abs 2.0  0.7 - 4.0 K/uL   Monocytes Relative 7  3 - 12 %   Monocytes Absolute 0.5  0.1 - 1.0 K/uL   Eosinophils Relative 4  0 - 5 %   Eosinophils Absolute 0.3  0.0 - 0.7 K/uL   Basophils Relative 1  0 - 1 %   Basophils Absolute 0.1  0.0 - 0.1 K/uL  LIPASE, BLOOD     Status: None   Collection Time    01/30/13  1:53 PM      Result Value Range   Lipase 57  11 - 59 U/L  MAGNESIUM     Status: None   Collection Time    01/30/13  1:53 PM      Result Value Range   Magnesium 2.0  1.5 - 2.5 mg/dL  SAMPLE TO BLOOD BANK     Status: None   Collection Time    01/30/13  2:14 PM      Result Value Range   Blood Bank Specimen SAMPLE AVAILABLE FOR TESTING     Sample Expiration 01/31/2013    LACTIC ACID, PLASMA     Status: None   Collection Time    01/30/13  2:14 PM      Result Value Range   Lactic Acid, Venous 1.4  0.5 - 2.2 mmol/L   Ct Head Wo Contrast  01/30/2013   ADDENDUM REPORT: 01/30/2013 16:00  ADDENDUM: The original report was by Dr. Gaylyn Rong. The following addendum is by Dr. Gaylyn Rong:  I discussed the critical findings in the CT head, cervical spine, chest, abdomen, and pelvis with doctor Alvira Monday at 4 o'clock p.m. on 01/30/2013 by telephone.    Electronically Signed   By: Herbie Baltimore   On: 01/30/2013 16:00   01/30/2013   CLINICAL DATA:  Fall from a ladder, 12 feet, loss of consciousness. Altered mental status.  EXAM: CT HEAD WITHOUT CONTRAST  CT CERVICAL SPINE WITHOUT CONTRAST  TECHNIQUE: Multidetector CT imaging of the head and cervical spine was performed following the standard protocol without intravenous contrast. Multiplanar CT image reconstructions of the cervical spine were also generated.  COMPARISON:  None.  FINDINGS: CT HEAD FINDINGS  Abnormal linear density along the inferior margin of the right temporal lobe on images 12-13 of series 3 is compatible with subarachnoid hemorrhage likely tracking along the sulcus. The brainstem and cerebellum appear unremarkable. Small hypodensity in the left globus pallidus nucleus may represent dilated perivascular space or a small remote lacunar infarct.  The ventricular system and basilar cisterns appear normal. No acute CVA or mass lesion.  Chronic maxillary, ethmoid, frontal, and sphenoid sinusitis.  CT CERVICAL SPINE FINDINGS  Cervical spondylosis and degenerative disk disease noted with loss of disc height at C3-4, C5-6, and C6-7, and posterior osseous ridging at all levels between C3 and C7. Intervertebral and facet spurring causes osseous foraminal stenosis on the right at C3-4 and C6-7, and on the left at C3-4, C5-6, and C6-7.  No prevertebral soft tissue swelling is present. A broad central disc protrusion at C2-3 causes borderline central stenosis.  A hypodense right thyroid lobe lesion measures 2.0 x 1.6 cm.  No cervical spine fracture or acute subluxation is identified.  IMPRESSION: CT HEAD IMPRESSION  1. Small amount of acute subarachnoid hemorrhage along the inferior margin of the right temporal lobe. 2. Chronic paranasal sinusitis.  CT CERVICAL SPINE IMPRESSION  1.  Cervical spondylosis and degenerative disc disease, causing multilevel foraminal impingement. 2. 2.0 cm right thyroid lobe  lesion. Consider further evaluation with thyroid US. If patient is clinically hyperthyroid, consider nuclear medicine thyroid scan. 3. Broad central disc protrusion at C2-3 with borderline central stenosis.  Electronically Signed: By: Herbie Baltimore On: 01/30/2013 15:37   Ct Chest W Contrast  01/30/2013   CLINICAL DATA:  Fall from a ladder, 12 feet with loss of consciousness. Back pain.  EXAM: CT CHEST, ABDOMEN, AND PELVIS WITH CONTRAST  TECHNIQUE: Multidetector CT imaging of the chest, abdomen and pelvis was performed following the standard protocol during bolus administration of intravenous contrast.  CONTRAST:  OMNIPAQUE IOHEXOL 300 MG/ML  SOLN  COMPARISON:  Chest radiograph, 01/30/2013  FINDINGS: CT CHEST FINDINGS  Precarinal lymph node short axis 0.8 cm. Coronary artery atherosclerosis. Small to moderate-sized hiatal hernia. No significant pleural effusion.  Multiple bilateral rib fractures are present. These include the right 6th, 7th, and 8th ribs anterolaterally; the left 4th, 5th, 6th, 7th, 8th, and 9th ribs anterolaterally; and the left 7th, 8th, and 9th ribs posteromedially, with the left 9th posteromedial rib fracture displaced. This makes the left 7th, 8th, and 9th rib fractures segmental.  Dependent subsegmental atelectasis noted in both lower lobes. No pneumothorax observed. There is a 4 mm nodule along the minor fissure.  CT ABDOMEN AND PELVIS FINDINGS  Scattered fluid density lesions in the liver favor cysts although some are too small to characterize. The largest measures 1.7 cm in diameter in the right hepatic lobe on image 60 of series 4. No hepatic laceration or splenic laceration identified.  Adrenal glands and pancreas within normal limits. Gallbladder appears normal.  Right renal cysts noted. Several small lower pole cysts are present on the right.  There is hematoma overlying the right iliac bone in the subcutaneous tissues post for laterally, without overt hematoma in the gluteal  musculature. There is a mixed density in the vicinity of the hematoma on image 86 of series 4, and a trace amount of subcutaneous active bleeding could not be readily excluded although the stranding in this vicinity is primarily infiltrative rather than masslike.  I do not see a lumbar spine fracture. Anterior epidural prominence in the lower lumbar spine is probably due to degenerative disc disease and venous plexus enhancement, although if the patient has significant neurologic abnormalities in the lower extremities then lumbar MRI may be warranted to exclude the unlikely possibility of epidural hematoma.  No pelvic fracture observed. Urinary bladder unremarkable.  IMPRESSION: CT CHEST IMPRESSION  1. Fractures of the right 6th, 7th, and 8th ribs. Fractures of the left 4th, 5th, and 6th ribs. Segmental fractures of the left 7th, 8th, and 9th ribs. 2. 4 mm nodule along the minor fissure. If the patient is at high risk for bronchogenic carcinoma, follow-up chest CT at 1year is recommended. If the patient is at low risk, no follow-up is needed. This recommendation follows the consensus statement: Guidelines for Management of Small Pulmonary Nodules Detected on CT Scans: A Statement from the Fleischner Society as published in Radiology 2005; 237:395-400. 3. Small to moderate-sized hiatal hernia. 4. Coronary artery atherosclerosis  CT ABDOMEN AND PELVIS IMPRESSION  1. Subcutaneous hematoma projecting superficial to the right iliac bone and gluteus musculature. There may be a tiny amount of active subcutaneous bleeding during the scan, but the hematoma in this vicinity is primarily infiltrative rather than masslike. 2. Hepatic and renal cysts. 3. In anterior epidural prominence in the  lower lumbar spine is likely due to venous plexus enhancement. If the patient has significant neurologic impairment in the lower extremities than lumbar MRI would be recommended to exclude the unlikely possibility of epidural hematoma.    Electronically Signed   By: Herbie Baltimore   On: 01/30/2013 15:52   Ct Cervical Spine Wo Contrast  01/30/2013   ADDENDUM REPORT: 01/30/2013 16:00  ADDENDUM: The original report was by Dr. Gaylyn Rong. The following addendum is by Dr. Gaylyn Rong:  I discussed the critical findings in the CT head, cervical spine, chest, abdomen, and pelvis with doctor Alvira Monday at 4 o'clock p.m. on 01/30/2013 by telephone.   Electronically Signed   By: Herbie Baltimore   On: 01/30/2013 16:00   01/30/2013   CLINICAL DATA:  Fall from a ladder, 12 feet, loss of consciousness. Altered mental status.  EXAM: CT HEAD WITHOUT CONTRAST  CT CERVICAL SPINE WITHOUT CONTRAST  TECHNIQUE: Multidetector CT imaging of the head and cervical spine was performed following the standard protocol without intravenous contrast. Multiplanar CT image reconstructions of the cervical spine were also generated.  COMPARISON:  None.  FINDINGS: CT HEAD FINDINGS  Abnormal linear density along the inferior margin of the right temporal lobe on images 12-13 of series 3 is compatible with subarachnoid hemorrhage likely tracking along the sulcus. The brainstem and cerebellum appear unremarkable. Small hypodensity in the left globus pallidus nucleus may represent dilated perivascular space or a small remote lacunar infarct.  The ventricular system and basilar cisterns appear normal. No acute CVA or mass lesion.  Chronic maxillary, ethmoid, frontal, and sphenoid sinusitis.  CT CERVICAL SPINE FINDINGS  Cervical spondylosis and degenerative disk disease noted with loss of disc height at C3-4, C5-6, and C6-7, and posterior osseous ridging at all levels between C3 and C7. Intervertebral and facet spurring causes osseous foraminal stenosis on the right at C3-4 and C6-7, and on the left at C3-4, C5-6, and C6-7.  No prevertebral soft tissue swelling is present. A broad central disc protrusion at C2-3 causes borderline central stenosis.  A hypodense right  thyroid lobe lesion measures 2.0 x 1.6 cm.  No cervical spine fracture or acute subluxation is identified.  IMPRESSION: CT HEAD IMPRESSION  1. Small amount of acute subarachnoid hemorrhage along the inferior margin of the right temporal lobe. 2. Chronic paranasal sinusitis.  CT CERVICAL SPINE IMPRESSION  1. Cervical spondylosis and degenerative disc disease, causing multilevel foraminal impingement. 2. 2.0 cm right thyroid lobe lesion. Consider further evaluation with thyroid US. If patient is clinically hyperthyroid, consider nuclear medicine thyroid scan. 3. Broad central disc protrusion at C2-3 with borderline central stenosis.  Electronically Signed: By: Herbie Baltimore On: 01/30/2013 15:37   Ct Abdomen Pelvis W Contrast  01/30/2013   CLINICAL DATA:  Fall from a ladder, 12 feet with loss of consciousness. Back pain.  EXAM: CT CHEST, ABDOMEN, AND PELVIS WITH CONTRAST  TECHNIQUE: Multidetector CT imaging of the chest, abdomen and pelvis was performed following the standard protocol during bolus administration of intravenous contrast.  CONTRAST:  OMNIPAQUE IOHEXOL 300 MG/ML  SOLN  COMPARISON:  Chest radiograph, 01/30/2013  FINDINGS: CT CHEST FINDINGS  Precarinal lymph node short axis 0.8 cm. Coronary artery atherosclerosis. Small to moderate-sized hiatal hernia. No significant pleural effusion.  Multiple bilateral rib fractures are present. These include the right 6th, 7th, and 8th ribs anterolaterally; the left 4th, 5th, 6th, 7th, 8th, and 9th ribs anterolaterally; and the left 7th, 8th, and 9th ribs posteromedially, with  the left 9th posteromedial rib fracture displaced. This makes the left 7th, 8th, and 9th rib fractures segmental.  Dependent subsegmental atelectasis noted in both lower lobes. No pneumothorax observed. There is a 4 mm nodule along the minor fissure.  CT ABDOMEN AND PELVIS FINDINGS  Scattered fluid density lesions in the liver favor cysts although some are too small to characterize. The  largest measures 1.7 cm in diameter in the right hepatic lobe on image 60 of series 4. No hepatic laceration or splenic laceration identified.  Adrenal glands and pancreas within normal limits. Gallbladder appears normal.  Right renal cysts noted. Several small lower pole cysts are present on the right.  There is hematoma overlying the right iliac bone in the subcutaneous tissues post for laterally, without overt hematoma in the gluteal musculature. There is a mixed density in the vicinity of the hematoma on image 86 of series 4, and a trace amount of subcutaneous active bleeding could not be readily excluded although the stranding in this vicinity is primarily infiltrative rather than masslike.  I do not see a lumbar spine fracture. Anterior epidural prominence in the lower lumbar spine is probably due to degenerative disc disease and venous plexus enhancement, although if the patient has significant neurologic abnormalities in the lower extremities then lumbar MRI may be warranted to exclude the unlikely possibility of epidural hematoma.  No pelvic fracture observed. Urinary bladder unremarkable.  IMPRESSION: CT CHEST IMPRESSION  1. Fractures of the right 6th, 7th, and 8th ribs. Fractures of the left 4th, 5th, and 6th ribs. Segmental fractures of the left 7th, 8th, and 9th ribs. 2. 4 mm nodule along the minor fissure. If the patient is at high risk for bronchogenic carcinoma, follow-up chest CT at 1year is recommended. If the patient is at low risk, no follow-up is needed. This recommendation follows the consensus statement: Guidelines for Management of Small Pulmonary Nodules Detected on CT Scans: A Statement from the Fleischner Society as published in Radiology 2005; 237:395-400. 3. Small to moderate-sized hiatal hernia. 4. Coronary artery atherosclerosis  CT ABDOMEN AND PELVIS IMPRESSION  1. Subcutaneous hematoma projecting superficial to the right iliac bone and gluteus musculature. There may be a tiny amount  of active subcutaneous bleeding during the scan, but the hematoma in this vicinity is primarily infiltrative rather than masslike. 2. Hepatic and renal cysts. 3. In anterior epidural prominence in the lower lumbar spine is likely due to venous plexus enhancement. If the patient has significant neurologic impairment in the lower extremities than lumbar MRI would be recommended to exclude the unlikely possibility of epidural hematoma.   Electronically Signed   By: Herbie Baltimore   On: 01/30/2013 15:52   Dg Chest Portable 1 View  01/30/2013   CLINICAL DATA:  Fall from ladder.  EXAM: PORTABLE CHEST - 1 VIEW  COMPARISON:  None.  FINDINGS: There is a left lateral 7th rib fracture. Right lateral 6th rib fracture noted. No pneumothorax. Heart is normal size. Lungs are clear. No effusions.  IMPRESSION: Right 6th and left 7th rib fractures. No visible pneumothorax.   Electronically Signed   By: Charlett Nose   On: 01/30/2013 14:05    Review of Systems  Constitutional: Negative for weight loss.  HENT: Negative for hearing loss, ear pain, neck pain, tinnitus and ear discharge.   Eyes: Negative for blurred vision, double vision, photophobia and pain.  Respiratory: Negative for cough, sputum production and shortness of breath.   Cardiovascular: Positive for chest pain.  Gastrointestinal:  Negative for nausea, vomiting and abdominal pain.  Genitourinary: Negative for dysuria, urgency, frequency and flank pain.  Musculoskeletal: Negative for myalgias, back pain, joint pain and falls.  Neurological: Positive for loss of consciousness and headaches. Negative for dizziness, tingling, sensory change and focal weakness.  Endo/Heme/Allergies: Does not bruise/bleed easily.  Psychiatric/Behavioral: Positive for memory loss. Negative for depression and substance abuse. The patient is not nervous/anxious.     Blood pressure 151/73, pulse 87, temperature 97.9 F (36.6 C), temperature source Oral, resp. rate 14, height 5'  6" (1.676 m), weight 78.472 kg (173 lb), SpO2 98.00%. Physical Exam  Vitals reviewed. Constitutional: He is oriented to person, place, and time. He appears well-developed and well-nourished. He is cooperative. No distress. Cervical collar and nasal cannula in place.  HENT:  Head: Normocephalic and atraumatic. Head is without raccoon's eyes, without Battle's sign, without abrasion, without contusion and without laceration.  Right Ear: Hearing, tympanic membrane, external ear and ear canal normal. No lacerations. No drainage or tenderness. No foreign bodies. Tympanic membrane is not perforated. No hemotympanum.  Left Ear: Hearing, tympanic membrane, external ear and ear canal normal. No lacerations. No drainage or tenderness. No foreign bodies. Tympanic membrane is not perforated. No hemotympanum.  Nose: Nose normal. No nose lacerations, sinus tenderness, nasal deformity or nasal septal hematoma. No epistaxis.  Mouth/Throat: Uvula is midline, oropharynx is clear and moist and mucous membranes are normal. No lacerations. No oropharyngeal exudate.  Eyes: Conjunctivae, EOM and lids are normal. Pupils are equal, round, and reactive to light. No scleral icterus.  Neck: Trachea normal. No JVD present. No spinous process tenderness and no muscular tenderness present. Carotid bruit is not present. No thyromegaly present.  Cardiovascular: Normal rate, regular rhythm, normal heart sounds, intact distal pulses and normal pulses.   Respiratory: Effort normal and breath sounds normal. No respiratory distress. He exhibits tenderness and bony tenderness. He exhibits no laceration and no crepitus.  Posterior rib pain bilaterally, no chest wall crepitance, abrasion medial left shoulder  GI: Soft. Normal appearance. He exhibits no distension. Bowel sounds are decreased. There is no tenderness. There is no rigidity, no rebound, no guarding and no CVA tenderness.  Musculoskeletal: Normal range of motion. He exhibits no  edema and no tenderness.  Lymphadenopathy:    He has no cervical adenopathy.  Neurological: He is alert and oriented to person, place, and time. He has normal strength. He displays no tremor. No cranial nerve deficit or sensory deficit. He exhibits normal muscle tone. He displays no seizure activity. GCS eye subscore is 4. GCS verbal subscore is 5. GCS motor subscore is 6.  Strength full and equal bilaterally  Skin: Skin is warm, dry and intact. He is not diaphoretic.  Psychiatric: He has a normal mood and affect. His speech is normal and behavior is normal.     Assessment/Plan Status post fall from ladder with right rib fractures 6-8 and left rib fractures 4-9, traumatic brain injury with right temporal subarachnoid hemorrhage, and left shoulder abrasion. Will make to the ICU. Neurosurgery consultation is pending. We'll work on pulmonary toilet and give bronchodilators. Plan followup CT head in the morning. Hold aspirin. Plan was discussed in detail the patient and his wife.  Deniya Craigo E 01/30/2013, 5:25 PM

## 2013-01-31 ENCOUNTER — Inpatient Hospital Stay (HOSPITAL_COMMUNITY): Payer: Medicare Other

## 2013-01-31 LAB — CBC
HCT: 37.1 % — ABNORMAL LOW (ref 39.0–52.0)
MCV: 97.4 fL (ref 78.0–100.0)
RBC: 3.81 MIL/uL — ABNORMAL LOW (ref 4.22–5.81)
WBC: 7.4 10*3/uL (ref 4.0–10.5)

## 2013-01-31 LAB — BASIC METABOLIC PANEL
BUN: 10 mg/dL (ref 6–23)
CO2: 26 mEq/L (ref 19–32)
Chloride: 108 mEq/L (ref 96–112)
Creatinine, Ser: 1.02 mg/dL (ref 0.50–1.35)
Glucose, Bld: 124 mg/dL — ABNORMAL HIGH (ref 70–99)

## 2013-01-31 MED ORDER — HYDROMORPHONE HCL PF 1 MG/ML IJ SOLN
0.5000 mg | INTRAMUSCULAR | Status: DC | PRN
  Administered 2013-02-01 – 2013-02-02 (×5): 0.5 mg via INTRAVENOUS
  Filled 2013-01-31 (×5): qty 1

## 2013-01-31 MED ORDER — HYDROCODONE-ACETAMINOPHEN 5-325 MG PO TABS
1.0000 | ORAL_TABLET | ORAL | Status: DC | PRN
  Administered 2013-01-31 (×2): 2 via ORAL
  Administered 2013-02-01: 1 via ORAL
  Filled 2013-01-31 (×2): qty 2
  Filled 2013-01-31: qty 1

## 2013-01-31 NOTE — Evaluation (Signed)
Occupational Therapy Evaluation Patient Details Name: Raymond Ellison MRN: [id number] DOB: 12-13-1940 Today's Date: 01/31/2013 Time: 6213-0865 OT Time Calculation (min): 28 min  OT Assessment / Plan / Recommendation History of present illness Pt admitted with right temporal SAH and right rib fx 6-8 and left rib fx 4-9 s/p fall from ladder. Pt is amnestic to event.   Clinical Impression   Pt admitted with above.  Pt demonstrating with decreased activity tolerance and increased pain along with below problem list. Will continue to follow pt acutely in order to address below problem list in prep for return home with wife.    OT Assessment  Patient needs continued OT Services    Follow Up Recommendations  Home health OT;Supervision/Assistance - 24 hour (HHOT vs none)    Barriers to Discharge      Equipment Recommendations  Tub/shower seat    Recommendations for Other Services    Frequency  Min 2X/week    Precautions / Restrictions Precautions Precaution Comments: Multiple bil rib fx   Pertinent Vitals/Pain See vitals    ADL  Eating/Feeding: Performed;Modified independent Where Assessed - Eating/Feeding: Chair Grooming: Performed;Wash/dry face;Set up Where Assessed - Grooming: Supported sitting Upper Body Bathing: Simulated;Moderate assistance Where Assessed - Upper Body Bathing: Unsupported sitting Lower Body Bathing: Simulated;Maximal assistance Where Assessed - Lower Body Bathing: Supported sit to stand Upper Body Dressing: Performed;Moderate assistance Where Assessed - Upper Body Dressing: Unsupported sitting Lower Body Dressing: Performed;Maximal assistance Where Assessed - Lower Body Dressing: Supported sit to stand Toilet Transfer: Simulated;Minimal assistance Toilet Transfer Method: Sit to Barista:  (bed) Equipment Used: Gait belt Transfers/Ambulation Related to ADLs: Min guard - min assist with ambulation.  Incr time due to pain/soreness.   Occasionally reaching with RUE for support on wall to steady himself. ADL Comments: Incr time for all tasks due to pain from rib fx's    OT Diagnosis: Generalized weakness;Acute pain  OT Problem List: Decreased strength;Decreased activity tolerance;Impaired balance (sitting and/or standing);Decreased knowledge of use of DME or AE;Decreased knowledge of precautions;Pain OT Treatment Interventions: Self-care/ADL training;DME and/or AE instruction;Therapeutic activities;Patient/family education;Balance training   OT Goals(Current goals can be found in the care plan section) Acute Rehab OT Goals Patient Stated Goal: to return to being independent OT Goal Formulation: With patient Time For Goal Achievement: 02/07/13 Potential to Achieve Goals: Good  Visit Information  Last OT Received On: 01/31/13 Assistance Needed: +1 PT/OT Co-Evaluation/Treatment: Yes History of Present Illness: Pt admitted with right temporal SAH and right rib fx 6-8 and left rib fx 4-9 s/p fall from ladder. Pt is amnestic to event.       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  States it seems a little worse today but is still able to function. Will continue to assess.   Cognition   Cognition Arousal/Alertness: Awake/alert Behavior During Therapy:  WFL for tasks assessed/performed Overall Cognitive Status: Within Functional Limits for tasks assessed Memory:  (does not recall fall from ladder)    Extremity/Trunk Assessment Upper Extremity Assessment Upper Extremity Assessment: Overall WFL for tasks assessed (unable to fully assess due to pain)     Mobility Bed Mobility Bed Mobility: Rolling Right;Right Sidelying to Sit;Sitting - Scoot to Edge of Bed Rolling Right: 4: Min guard;With rail Right Sidelying to Sit: 3: Mod assist;HOB elevated Sitting - Scoot to Edge of Bed: 4: Min guard Transfers Transfers: Sit to Stand;Stand to Sit Sit to Stand: 4: Min assist;From bed;With upper extremity assist Stand to Sit: To chair/3-in-1;With armrests;With upper extremity assist;4: Min assist     Exercise     Balance Balance Balance Assessed: Yes Static Sitting Balance Static Sitting - Balance Support: No upper extremity supported;Feet supported Static Sitting - Level of Assistance: 5: Stand by assistance Static Standing Balance Static Standing - Balance Support: Right upper extremity supported Static Standing - Level of Assistance: 5: Stand by assistance Static Standing - Comment/# of Minutes: RUE supported on bed for balance. Stood 1-2 minutes.   End of Session OT - End of Session Equipment Utilized During Treatment: Gait belt Activity Tolerance: Patient tolerated treatment well Patient left: in chair;with call bell/phone within reach Nurse Communication: Mobility status  GO   01/31/2013 Cipriano Mile OTR/L Pager [phone] Office [phone]   Cipriano Mile 01/31/2013, 10:05 AM

## 2013-01-31 NOTE — Progress Notes (Signed)
Patient ID: Raymond Ellison, male   DOB: 06-25-1940, 72 y.o.   MRN: [id number]  LOS: 1 day   Subjective: Pt feeling better, less pain.  Not using much of PCA dilaudid.  Had an episode of nausea overnight, no vomiting.  Denies headaches, vision changes or weakness.  Denies shortness of breath.  Objective: Vital signs in last 24 hours: Temp:  [97.7 F (36.5 C)-99.6 F (37.6 C)] 97.7 F (36.5 C) (08/27 0400) Pulse Rate:  [69-88] 72 (08/27 0700) Resp:  [8-25] 16 (08/27 0700) BP: (123-178)/(58-96) 131/58 mmHg (08/27 0700) SpO2:  [93 %-100 %] 93 % (08/27 0700) Weight:  [173 lb (78.472 kg)] 173 lb (78.472 kg) (08/26 1654)    Lab Results:  CBC  Recent Labs  01/30/13 1353 01/31/13 0400  WBC 7.4 7.4  HGB 14.0 12.4*  HCT 39.0 37.1*  PLT 182 149*   BMET  Recent Labs  01/30/13 1353 01/31/13 0400  NA 142 142  K 3.9 4.1  CL 106 108  CO2 24 26  GLUCOSE 112* 124*  BUN 12 10  CREATININE 1.12 1.02  CALCIUM 9.7 8.8    Imaging: Ct Head Wo Contrast  01/30/2013   ADDENDUM REPORT: 01/30/2013 16:00  ADDENDUM: The original report was by Dr. Gaylyn Rong. The following addendum is by Dr. Gaylyn Rong:  I discussed the critical findings in the CT head, cervical spine, chest, abdomen, and pelvis with doctor Alvira Monday at 4 o'clock p.m. on 01/30/2013 by telephone.   Electronically Signed   By: Herbie Baltimore   On: 01/30/2013 16:00   01/30/2013   CLINICAL DATA:  Fall from a ladder, 12 feet, loss of consciousness. Altered mental status.  EXAM: CT HEAD WITHOUT CONTRAST  CT CERVICAL SPINE WITHOUT CONTRAST  TECHNIQUE: Multidetector CT imaging of the head and cervical spine was performed following the standard protocol without intravenous contrast. Multiplanar CT image reconstructions of the cervical spine were also generated.  COMPARISON:  None.  FINDINGS: CT HEAD FINDINGS  Abnormal linear density along the inferior margin of the right temporal lobe on images 12-13 of series 3 is  compatible with subarachnoid hemorrhage likely tracking along the sulcus. The brainstem and cerebellum appear unremarkable. Small hypodensity in the left globus pallidus nucleus may represent dilated perivascular space or a small remote lacunar infarct.  The ventricular system and basilar cisterns appear normal. No acute CVA or mass lesion.  Chronic maxillary, ethmoid, frontal, and sphenoid sinusitis.  CT CERVICAL SPINE FINDINGS  Cervical spondylosis and degenerative disk disease noted with loss of disc height at C3-4, C5-6, and C6-7, and posterior osseous ridging at all levels between C3 and C7. Intervertebral and facet spurring causes osseous foraminal stenosis on the right at C3-4 and C6-7, and on the left at C3-4, C5-6, and C6-7.  No prevertebral soft tissue swelling is present. A broad central disc protrusion at C2-3 causes borderline central stenosis.  A hypodense right thyroid lobe lesion measures 2.0 x 1.6 cm.  No cervical spine fracture or acute subluxation is identified.  IMPRESSION: CT HEAD IMPRESSION  1. Small amount of acute subarachnoid hemorrhage along the inferior margin of the right temporal lobe. 2. Chronic paranasal sinusitis.  CT CERVICAL SPINE IMPRESSION  1. Cervical spondylosis and degenerative disc disease, causing multilevel foraminal impingement. 2. 2.0 cm right thyroid lobe lesion. Consider further evaluation with thyroid US. If patient is clinically hyperthyroid, consider nuclear medicine thyroid scan. 3. Broad central disc protrusion at C2-3 with borderline central stenosis.  Electronically Signed: By:  Herbie Baltimore On: 01/30/2013 15:37   Ct Chest W Contrast  01/30/2013   CLINICAL DATA:  Fall from a ladder, 12 feet with loss of consciousness. Back pain.  EXAM: CT CHEST, ABDOMEN, AND PELVIS WITH CONTRAST  TECHNIQUE: Multidetector CT imaging of the chest, abdomen and pelvis was performed following the standard protocol during bolus administration of intravenous contrast.  CONTRAST:   OMNIPAQUE IOHEXOL 300 MG/ML  SOLN  COMPARISON:  Chest radiograph, 01/30/2013  FINDINGS: CT CHEST FINDINGS  Precarinal lymph node short axis 0.8 cm. Coronary artery atherosclerosis. Small to moderate-sized hiatal hernia. No significant pleural effusion.  Multiple bilateral rib fractures are present. These include the right 6th, 7th, and 8th ribs anterolaterally; the left 4th, 5th, 6th, 7th, 8th, and 9th ribs anterolaterally; and the left 7th, 8th, and 9th ribs posteromedially, with the left 9th posteromedial rib fracture displaced. This makes the left 7th, 8th, and 9th rib fractures segmental.  Dependent subsegmental atelectasis noted in both lower lobes. No pneumothorax observed. There is a 4 mm nodule along the minor fissure.  CT ABDOMEN AND PELVIS FINDINGS  Scattered fluid density lesions in the liver favor cysts although some are too small to characterize. The largest measures 1.7 cm in diameter in the right hepatic lobe on image 60 of series 4. No hepatic laceration or splenic laceration identified.  Adrenal glands and pancreas within normal limits. Gallbladder appears normal.  Right renal cysts noted. Several small lower pole cysts are present on the right.  There is hematoma overlying the right iliac bone in the subcutaneous tissues post for laterally, without overt hematoma in the gluteal musculature. There is a mixed density in the vicinity of the hematoma on image 86 of series 4, and a trace amount of subcutaneous active bleeding could not be readily excluded although the stranding in this vicinity is primarily infiltrative rather than masslike.  I do not see a lumbar spine fracture. Anterior epidural prominence in the lower lumbar spine is probably due to degenerative disc disease and venous plexus enhancement, although if the patient has significant neurologic abnormalities in the lower extremities then lumbar MRI may be warranted to exclude the unlikely possibility of epidural hematoma.  No pelvic  fracture observed. Urinary bladder unremarkable.  IMPRESSION: CT CHEST IMPRESSION  1. Fractures of the right 6th, 7th, and 8th ribs. Fractures of the left 4th, 5th, and 6th ribs. Segmental fractures of the left 7th, 8th, and 9th ribs. 2. 4 mm nodule along the minor fissure. If the patient is at high risk for bronchogenic carcinoma, follow-up chest CT at 1year is recommended. If the patient is at low risk, no follow-up is needed. This recommendation follows the consensus statement: Guidelines for Management of Small Pulmonary Nodules Detected on CT Scans: A Statement from the Fleischner Society as published in Radiology 2005; 237:395-400. 3. Small to moderate-sized hiatal hernia. 4. Coronary artery atherosclerosis  CT ABDOMEN AND PELVIS IMPRESSION  1. Subcutaneous hematoma projecting superficial to the right iliac bone and gluteus musculature. There may be a tiny amount of active subcutaneous bleeding during the scan, but the hematoma in this vicinity is primarily infiltrative rather than masslike. 2. Hepatic and renal cysts. 3. In anterior epidural prominence in the lower lumbar spine is likely due to venous plexus enhancement. If the patient has significant neurologic impairment in the lower extremities than lumbar MRI would be recommended to exclude the unlikely possibility of epidural hematoma.   Electronically Signed   By: Herbie Baltimore   On:  01/30/2013 15:52   Ct Cervical Spine Wo Contrast  01/30/2013   ADDENDUM REPORT: 01/30/2013 16:00  ADDENDUM: The original report was by Dr. Gaylyn Rong. The following addendum is by Dr. Gaylyn Rong:  I discussed the critical findings in the CT head, cervical spine, chest, abdomen, and pelvis with doctor Alvira Monday at 4 o'clock p.m. on 01/30/2013 by telephone.   Electronically Signed   By: Herbie Baltimore   On: 01/30/2013 16:00   01/30/2013   CLINICAL DATA:  Fall from a ladder, 12 feet, loss of consciousness. Altered mental status.  EXAM: CT HEAD  WITHOUT CONTRAST  CT CERVICAL SPINE WITHOUT CONTRAST  TECHNIQUE: Multidetector CT imaging of the head and cervical spine was performed following the standard protocol without intravenous contrast. Multiplanar CT image reconstructions of the cervical spine were also generated.  COMPARISON:  None.  FINDINGS: CT HEAD FINDINGS  Abnormal linear density along the inferior margin of the right temporal lobe on images 12-13 of series 3 is compatible with subarachnoid hemorrhage likely tracking along the sulcus. The brainstem and cerebellum appear unremarkable. Small hypodensity in the left globus pallidus nucleus may represent dilated perivascular space or a small remote lacunar infarct.  The ventricular system and basilar cisterns appear normal. No acute CVA or mass lesion.  Chronic maxillary, ethmoid, frontal, and sphenoid sinusitis.  CT CERVICAL SPINE FINDINGS  Cervical spondylosis and degenerative disk disease noted with loss of disc height at C3-4, C5-6, and C6-7, and posterior osseous ridging at all levels between C3 and C7. Intervertebral and facet spurring causes osseous foraminal stenosis on the right at C3-4 and C6-7, and on the left at C3-4, C5-6, and C6-7.  No prevertebral soft tissue swelling is present. A broad central disc protrusion at C2-3 causes borderline central stenosis.  A hypodense right thyroid lobe lesion measures 2.0 x 1.6 cm.  No cervical spine fracture or acute subluxation is identified.  IMPRESSION: CT HEAD IMPRESSION  1. Small amount of acute subarachnoid hemorrhage along the inferior margin of the right temporal lobe. 2. Chronic paranasal sinusitis.  CT CERVICAL SPINE IMPRESSION  1. Cervical spondylosis and degenerative disc disease, causing multilevel foraminal impingement. 2. 2.0 cm right thyroid lobe lesion. Consider further evaluation with thyroid US. If patient is clinically hyperthyroid, consider nuclear medicine thyroid scan. 3. Broad central disc protrusion at C2-3 with borderline  central stenosis.  Electronically Signed: By: Herbie Baltimore On: 01/30/2013 15:37   Ct Abdomen Pelvis W Contrast  01/30/2013   CLINICAL DATA:  Fall from a ladder, 12 feet with loss of consciousness. Back pain.  EXAM: CT CHEST, ABDOMEN, AND PELVIS WITH CONTRAST  TECHNIQUE: Multidetector CT imaging of the chest, abdomen and pelvis was performed following the standard protocol during bolus administration of intravenous contrast.  CONTRAST:  OMNIPAQUE IOHEXOL 300 MG/ML  SOLN  COMPARISON:  Chest radiograph, 01/30/2013  FINDINGS: CT CHEST FINDINGS  Precarinal lymph node short axis 0.8 cm. Coronary artery atherosclerosis. Small to moderate-sized hiatal hernia. No significant pleural effusion.  Multiple bilateral rib fractures are present. These include the right 6th, 7th, and 8th ribs anterolaterally; the left 4th, 5th, 6th, 7th, 8th, and 9th ribs anterolaterally; and the left 7th, 8th, and 9th ribs posteromedially, with the left 9th posteromedial rib fracture displaced. This makes the left 7th, 8th, and 9th rib fractures segmental.  Dependent subsegmental atelectasis noted in both lower lobes. No pneumothorax observed. There is a 4 mm nodule along the minor fissure.  CT ABDOMEN AND PELVIS FINDINGS  Scattered  fluid density lesions in the liver favor cysts although some are too small to characterize. The largest measures 1.7 cm in diameter in the right hepatic lobe on image 60 of series 4. No hepatic laceration or splenic laceration identified.  Adrenal glands and pancreas within normal limits. Gallbladder appears normal.  Right renal cysts noted. Several small lower pole cysts are present on the right.  There is hematoma overlying the right iliac bone in the subcutaneous tissues post for laterally, without overt hematoma in the gluteal musculature. There is a mixed density in the vicinity of the hematoma on image 86 of series 4, and a trace amount of subcutaneous active bleeding could not be readily excluded  although the stranding in this vicinity is primarily infiltrative rather than masslike.  I do not see a lumbar spine fracture. Anterior epidural prominence in the lower lumbar spine is probably due to degenerative disc disease and venous plexus enhancement, although if the patient has significant neurologic abnormalities in the lower extremities then lumbar MRI may be warranted to exclude the unlikely possibility of epidural hematoma.  No pelvic fracture observed. Urinary bladder unremarkable.  IMPRESSION: CT CHEST IMPRESSION  1. Fractures of the right 6th, 7th, and 8th ribs. Fractures of the left 4th, 5th, and 6th ribs. Segmental fractures of the left 7th, 8th, and 9th ribs. 2. 4 mm nodule along the minor fissure. If the patient is at high risk for bronchogenic carcinoma, follow-up chest CT at 1year is recommended. If the patient is at low risk, no follow-up is needed. This recommendation follows the consensus statement: Guidelines for Management of Small Pulmonary Nodules Detected on CT Scans: A Statement from the Fleischner Society as published in Radiology 2005; 237:395-400. 3. Small to moderate-sized hiatal hernia. 4. Coronary artery atherosclerosis  CT ABDOMEN AND PELVIS IMPRESSION  1. Subcutaneous hematoma projecting superficial to the right iliac bone and gluteus musculature. There may be a tiny amount of active subcutaneous bleeding during the scan, but the hematoma in this vicinity is primarily infiltrative rather than masslike. 2. Hepatic and renal cysts. 3. In anterior epidural prominence in the lower lumbar spine is likely due to venous plexus enhancement. If the patient has significant neurologic impairment in the lower extremities than lumbar MRI would be recommended to exclude the unlikely possibility of epidural hematoma.   Electronically Signed   By: Herbie Baltimore   On: 01/30/2013 15:52   Dg Chest Portable 1 View  01/30/2013   CLINICAL DATA:  Fall from ladder.  EXAM: PORTABLE CHEST - 1 VIEW   COMPARISON:  None.  FINDINGS: There is a left lateral 7th rib fracture. Right lateral 6th rib fracture noted. No pneumothorax. Heart is normal size. Lungs are clear. No effusions.  IMPRESSION: Right 6th and left 7th rib fractures. No visible pneumothorax.   Electronically Signed   By: Charlett Nose   On: 01/30/2013 14:05    PE: General appearance: alert, cooperative, appears stated age and no distress Eyes: conjunctivae/corneas clear. PERRL, EOM's intact. Fundi benign. Resp: clear to auscultation bilaterally Cardio: regular rate and rhythm, S1, S2 normal, no murmur, click, rub or gallop GI: soft, non-tender; bowel sounds normal; no masses,  no organomegaly Extremities: extremities normal, atraumatic, no cyanosis or edema Neurologic: Grossly normal   Patient Active Problem List   Diagnosis Date Noted  . Multiple rib fractures 01/30/2013  . Traumatic subarachnoid bleed with LOC of 30 minutes or less 01/30/2013    Assessment/Plan: Fall from ladder Right temporal SAH -repeat CT of  head, will await formal results before transfer to SDU -PT/OT eval Right rib fractures 6-8 and left rib fractures 4-9 -aggressive pulmonary toilet -pain control, DC PCA and start oral medication with PRN IVP dilaudid  -mobilize  VTE - SCD's, no lovenox, mobilize HTN-continue home meds FEN - advance diet Dispo -- transfer to SDU, hopefully home tomorrow   Ashok Norris, ANP-BC Pager: [phone] General Trauma PA Pager: [phone]   01/31/2013 7:20 AM

## 2013-01-31 NOTE — Progress Notes (Signed)
Patient ID: Raymond Ellison, male   DOB: November 21, 1940, 72 y.o.   MRN: [id number] F/U ct looks good. I will sign off. Follow up with NS prn.

## 2013-01-31 NOTE — Progress Notes (Signed)
UR completed 

## 2013-01-31 NOTE — Progress Notes (Signed)
CT H improved. Rib FXs are the ongoing issue. Doing about 1000cc on IS. Begin therapies and transfer to 3S. Patient examined and I agree with the assessment and plan  Violeta Gelinas, MD, MPH, FACS Pager: [phone]  01/31/2013 8:25 AM

## 2013-01-31 NOTE — Evaluation (Signed)
Physical Therapy Evaluation Patient Details Name: Raymond Ellison MRN: [id number] DOB: [birth year] Today's Date: 01/31/2013 Time: 0901-0930 PT Time Calculation (min): 29 min  PT Assessment / Plan / Recommendation History of Present Illness  Pt admitted with right temporal SAH and right rib fx 6-8 and left rib fx 4-9 s/p fall from ladder. Pt is amnestic to event.  Clinical Impression  Pt very motivated to return to being independent, but at this time is limited by pain from Bil rib fxs.  Feel pt will continue to make good progress to return to home with wife.      PT Assessment  Patient needs continued PT services    Follow Up Recommendations  Home health PT;Supervision - Intermittent    Does the patient have the potential to tolerate intense rehabilitation      Barriers to Discharge        Equipment Recommendations  None recommended by PT    Recommendations for Other Services     Frequency Min 5X/week    Precautions / Restrictions Precautions Precaution Comments: Multiple bil rib fx Restrictions Weight Bearing Restrictions: No   Pertinent Vitals/Pain Pain 2/10 in Bil ribs.        Mobility  Bed Mobility Bed Mobility: Rolling Right;Right Sidelying to Sit;Sitting - Scoot to Edge of Bed Rolling Right: 4: Min guard;With rail Right Sidelying to Sit: 3: Mod assist;HOB elevated Sitting - Scoot to Edge of Bed: 4: Min guard Details for Bed Mobility Assistance: cues for log roll technique.   Transfers Transfers: Sit to Stand;Stand to Sit Sit to Stand: 4: Min assist;From bed;With upper extremity assist Stand to Sit: To chair/3-in-1;With armrests;With upper extremity assist;4: Min assist Details for Transfer Assistance: cues for use of UEs and controlling descent to chair.   Ambulation/Gait Ambulation/Gait Assistance: 4: Min assist Ambulation Distance (Feet): 200 Feet Assistive device: 1 person hand held assist Ambulation/Gait Assistance Details: pt moves slowly and cautiously  2/2 rib pain, but indicates feeling better amb than being in bed.   Gait Pattern: Step-through pattern;Decreased stride length Stairs: No Wheelchair Mobility Wheelchair Mobility: No    Exercises     PT Diagnosis: Difficulty walking;Acute pain  PT Problem List: Decreased strength;Decreased activity tolerance;Decreased balance;Decreased mobility;Decreased knowledge of use of DME;Pain PT Treatment Interventions: DME instruction;Gait training;Stair training;Functional mobility training;Therapeutic activities;Balance training;Therapeutic exercise;Patient/family education     PT Goals(Current goals can be found in the care plan section) Acute Rehab PT Goals Patient Stated Goal: to return to being independent PT Goal Formulation: With patient Time For Goal Achievement: 02/14/13 Potential to Achieve Goals: Good  Visit Information  Last PT Received On: 01/31/13 Assistance Needed: +1 PT/OT Co-Evaluation/Treatment: Yes History of Present Illness: Pt admitted with right temporal SAH and right rib fx 6-8 and left rib fx 4-9 s/p fall from ladder. Pt is amnestic to event.       Prior Functioning  Home Living Family/patient expects to be discharged to:: Private residence Living Arrangements: Spouse/significant other Available Help at Discharge: Family;Available 24 hours/day Type of Home: House Home Access: Stairs to enter [employer] of Steps: 5 Entrance Stairs-Rails: Left Home Layout: Two level;Able to live on main level with bedroom/bathroom Home Equipment: None Prior Function Level of Independence: Independent Communication Communication: No difficulties    Cognition  Cognition Arousal/Alertness: Awake/alert Behavior During Therapy: WFL for tasks assessed/performed Overall Cognitive Status: Within Functional Limits for tasks assessed Memory:  (does not recall fall from ladder)    Extremity/Trunk Assessment Upper Extremity Assessment Upper Extremity Assessment:  Overall WFL for tasks assessed (unable to fully assess due to pain) Lower Extremity Assessment Lower Extremity Assessment: RLE deficits/detail;LLE deficits/detail RLE: Unable to fully assess due to pain LLE: Unable to fully assess due to pain Cervical / Trunk Assessment Cervical / Trunk Assessment: Normal   Balance Balance Balance Assessed: Yes Static Sitting Balance Static Sitting - Balance Support: No upper extremity supported;Feet supported Static Sitting - Level of Assistance: 5: Stand by assistance Static Standing Balance Static Standing - Balance Support: Right upper extremity supported Static Standing - Level of Assistance: 5: Stand by assistance Static Standing - Comment/# of Minutes: RUE supported on bed for balance. Stood 1-2 minutes.  End of Session PT - End of Session Equipment Utilized During Treatment: Gait belt Activity Tolerance: Patient limited by pain Patient left: in chair;with call bell/phone within reach Nurse Communication: Mobility status  GP     Sunny Schlein, [place] [phone] 01/31/2013, 10:20 AM

## 2013-02-01 MED ORDER — TRAMADOL HCL 50 MG PO TABS
50.0000 mg | ORAL_TABLET | Freq: Three times a day (TID) | ORAL | Status: DC
  Administered 2013-02-01 – 2013-02-02 (×3): 50 mg via ORAL
  Filled 2013-02-01 (×3): qty 1

## 2013-02-01 MED ORDER — OXYCODONE-ACETAMINOPHEN 5-325 MG PO TABS
1.0000 | ORAL_TABLET | ORAL | Status: DC | PRN
  Administered 2013-02-01: 1 via ORAL
  Administered 2013-02-01: 2 via ORAL
  Filled 2013-02-01: qty 2
  Filled 2013-02-01: qty 1

## 2013-02-01 NOTE — Clinical Social Work Note (Signed)
Clinical Social Work Department BRIEF PSYCHOSOCIAL ASSESSMENT 02/01/2013  Patient:  Raymond Ellison, Raymond Ellison     Account Number:  [id number]     Admit date:  01/30/2013  Clinical Social Worker:  Verl Blalock  Date/Time:  02/01/2013 11:45 AM  Referred by:  Physician  Date Referred:  02/01/2013 Referred for  Psychosocial assessment   Other Referral:   Interview type:  Patient Other interview type:   Patient wife at bedside    PSYCHOSOCIAL DATA Living Status:  WIFE Admitted from facility:   Level of care:   Primary support name:  Shloima, Clinch  [phone] Primary support relationship to patient:  SPOUSE Degree of support available:   Strong    CURRENT CONCERNS Current Concerns  None Noted   Other Concerns:    SOCIAL WORK ASSESSMENT / PLAN Clinical Social Worker met with patient and patient wife at bedside to offer support and discuss patient needs at discharge.  Patient states that he was on a 10 foot ladder cutting tree limbs when he fell.  Patient and patient wife are both retired, so patient will have 24 hour support at discharge.  Patient very involved in his church and will have additional support from his church family.  Patient plans to return home with his wife who is able to provide transportation once medically ready.    Clinical Social Worker inquired about current substance use.  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  CSW signing off at this time.  Please reconsult if further needs arise prior to discharge.   Assessment/plan status:  No Further Intervention Required Other assessment/ plan:   Information/referral to community resources:   Patient declined all resources at this time.  Patient states that through his church involvement and church family him and his wife have everything they need.    PATIENT'S/FAMILY'S RESPONSE TO PLAN OF CARE: Patient alert and oriented x3 sitting up  in the chair with several visitors.  Patient with good family support and feels very blessed for all the wonderful people in his life able to assist him and his wife.  Patient is anxious to get home.  Patient and his wife both verbalized their appreciation for CSW support and concern.

## 2013-02-01 NOTE — Progress Notes (Signed)
Patient ID: Raymond Ellison, male   DOB: 09-Jan-1941, 72 y.o.   MRN: [id number]  LOS: 2 days   Subjective: Pt states he feels better, but pain increased when asked to use IS and reposition requiring IV pain medication.  Tolerating diet.  Voiding without difficulties.    Objective: Vital signs in last 24 hours: Temp:  [97.7 F (36.5 C)-98.6 F (37 C)] 98.1 F (36.7 C) (08/28 0413) Pulse Rate:  [59-80] 77 (08/28 0413) Resp:  [16-23] 23 (08/28 0413) BP: (125-172)/(56-71) 140/70 mmHg (08/28 0413) SpO2:  [93 %-98 %] 97 % (08/28 0413)    Lab Results:  CBC  Recent Labs  01/30/13 1353 01/31/13 0400  WBC 7.4 7.4  HGB 14.0 12.4*  HCT 39.0 37.1*  PLT 182 149*   BMET  Recent Labs  01/30/13 1353 01/31/13 0400  NA 142 142  K 3.9 4.1  CL 106 108  CO2 24 26  GLUCOSE 112* 124*  BUN 12 10  CREATININE 1.12 1.02  CALCIUM 9.7 8.8    Imaging: Ct Head Without Contrast  01/31/2013   *RADIOLOGY REPORT*  Clinical Data: Traumatic subarachnoid hemorrhage. Subsequent evaluation.  Follow-up.  CT HEAD WITHOUT CONTRAST  Technique:  Contiguous axial images were obtained from the base of the skull through the vertex without contrast.  Comparison: 01/30/2013.  Findings: The right temporal subarachnoid hemorrhage remains visible although the conspicuity has diminished significantly compared to prior with near resolution.  There is no hydrocephalus. There is no blood in the suprasellar cistern or intercondylar notch.  Ethmoid air cell mucosal thickening is present.  Mastoid air cells clear.  Calvarium intact.  Posterior fossa structures appear normal.  No mass lesion, midline shift or evidence of acute infarct. High attenuation is present along the inferior aspect of the falx most compatible with the inferior sagittal sinus rather than subdural blood. Debris is present in both external auditory canals.  IMPRESSION: 1.  Resolving right temporal subarachnoid hemorrhage. 2.  Negative for hydrocephalus or other  acute intracranial abnormality. 3.  Paranasal sinus disease.   Original Report Authenticated By: Andreas Newport, M.D.   Ct Head Wo Contrast  01/30/2013   ADDENDUM REPORT: 01/30/2013 16:00  ADDENDUM: The original report was by Dr. Gaylyn Rong. The following addendum is by Dr. Gaylyn Rong:  I discussed the critical findings in the CT head, cervical spine, chest, abdomen, and pelvis with doctor Alvira Monday at 4 o'clock p.m. on 01/30/2013 by telephone.   Electronically Signed   By: Herbie Baltimore   On: 01/30/2013 16:00   01/30/2013   CLINICAL DATA:  Fall from a ladder, 12 feet, loss of consciousness. Altered mental status.  EXAM: CT HEAD WITHOUT CONTRAST  CT CERVICAL SPINE WITHOUT CONTRAST  TECHNIQUE: Multidetector CT imaging of the head and cervical spine was performed following the standard protocol without intravenous contrast. Multiplanar CT image reconstructions of the cervical spine were also generated.  COMPARISON:  None.  FINDINGS: CT HEAD FINDINGS  Abnormal linear density along the inferior margin of the right temporal lobe on images 12-13 of series 3 is compatible with subarachnoid hemorrhage likely tracking along the sulcus. The brainstem and cerebellum appear unremarkable. Small hypodensity in the left globus pallidus nucleus may represent dilated perivascular space or a small remote lacunar infarct.  The ventricular system and basilar cisterns appear normal. No acute CVA or mass lesion.  Chronic maxillary, ethmoid, frontal, and sphenoid sinusitis.  CT CERVICAL SPINE FINDINGS  Cervical spondylosis and degenerative disk disease noted with loss  of disc height at C3-4, C5-6, and C6-7, and posterior osseous ridging at all levels between C3 and C7. Intervertebral and facet spurring causes osseous foraminal stenosis on the right at C3-4 and C6-7, and on the left at C3-4, C5-6, and C6-7.  No prevertebral soft tissue swelling is present. A broad central disc protrusion at C2-3 causes borderline  central stenosis.  A hypodense right thyroid lobe lesion measures 2.0 x 1.6 cm.  No cervical spine fracture or acute subluxation is identified.  IMPRESSION: CT HEAD IMPRESSION  1. Small amount of acute subarachnoid hemorrhage along the inferior margin of the right temporal lobe. 2. Chronic paranasal sinusitis.  CT CERVICAL SPINE IMPRESSION  1. Cervical spondylosis and degenerative disc disease, causing multilevel foraminal impingement. 2. 2.0 cm right thyroid lobe lesion. Consider further evaluation with thyroid US. If patient is clinically hyperthyroid, consider nuclear medicine thyroid scan. 3. Broad central disc protrusion at C2-3 with borderline central stenosis.  Electronically Signed: By: Herbie Baltimore On: 01/30/2013 15:37   Ct Chest W Contrast  01/30/2013   CLINICAL DATA:  Fall from a ladder, 12 feet with loss of consciousness. Back pain.  EXAM: CT CHEST, ABDOMEN, AND PELVIS WITH CONTRAST  TECHNIQUE: Multidetector CT imaging of the chest, abdomen and pelvis was performed following the standard protocol during bolus administration of intravenous contrast.  CONTRAST:  OMNIPAQUE IOHEXOL 300 MG/ML  SOLN  COMPARISON:  Chest radiograph, 01/30/2013  FINDINGS: CT CHEST FINDINGS  Precarinal lymph node short axis 0.8 cm. Coronary artery atherosclerosis. Small to moderate-sized hiatal hernia. No significant pleural effusion.  Multiple bilateral rib fractures are present. These include the right 6th, 7th, and 8th ribs anterolaterally; the left 4th, 5th, 6th, 7th, 8th, and 9th ribs anterolaterally; and the left 7th, 8th, and 9th ribs posteromedially, with the left 9th posteromedial rib fracture displaced. This makes the left 7th, 8th, and 9th rib fractures segmental.  Dependent subsegmental atelectasis noted in both lower lobes. No pneumothorax observed. There is a 4 mm nodule along the minor fissure.  CT ABDOMEN AND PELVIS FINDINGS  Scattered fluid density lesions in the liver favor cysts although some are  too small to characterize. The largest measures 1.7 cm in diameter in the right hepatic lobe on image 60 of series 4. No hepatic laceration or splenic laceration identified.  Adrenal glands and pancreas within normal limits. Gallbladder appears normal.  Right renal cysts noted. Several small lower pole cysts are present on the right.  There is hematoma overlying the right iliac bone in the subcutaneous tissues post for laterally, without overt hematoma in the gluteal musculature. There is a mixed density in the vicinity of the hematoma on image 86 of series 4, and a trace amount of subcutaneous active bleeding could not be readily excluded although the stranding in this vicinity is primarily infiltrative rather than masslike.  I do not see a lumbar spine fracture. Anterior epidural prominence in the lower lumbar spine is probably due to degenerative disc disease and venous plexus enhancement, although if the patient has significant neurologic abnormalities in the lower extremities then lumbar MRI may be warranted to exclude the unlikely possibility of epidural hematoma.  No pelvic fracture observed. Urinary bladder unremarkable.  IMPRESSION: CT CHEST IMPRESSION  1. Fractures of the right 6th, 7th, and 8th ribs. Fractures of the left 4th, 5th, and 6th ribs. Segmental fractures of the left 7th, 8th, and 9th ribs. 2. 4 mm nodule along the minor fissure. If the patient is at high risk  for bronchogenic carcinoma, follow-up chest CT at 1year is recommended. If the patient is at low risk, no follow-up is needed. This recommendation follows the consensus statement: Guidelines for Management of Small Pulmonary Nodules Detected on CT Scans: A Statement from the Fleischner Society as published in Radiology 2005; 237:395-400. 3. Small to moderate-sized hiatal hernia. 4. Coronary artery atherosclerosis  CT ABDOMEN AND PELVIS IMPRESSION  1. Subcutaneous hematoma projecting superficial to the right iliac bone and gluteus  musculature. There may be a tiny amount of active subcutaneous bleeding during the scan, but the hematoma in this vicinity is primarily infiltrative rather than masslike. 2. Hepatic and renal cysts. 3. In anterior epidural prominence in the lower lumbar spine is likely due to venous plexus enhancement. If the patient has significant neurologic impairment in the lower extremities than lumbar MRI would be recommended to exclude the unlikely possibility of epidural hematoma.   Electronically Signed   By: Herbie Baltimore   On: 01/30/2013 15:52   Ct Cervical Spine Wo Contrast  01/30/2013   ADDENDUM REPORT: 01/30/2013 16:00  ADDENDUM: The original report was by Dr. Gaylyn Rong. The following addendum is by Dr. Gaylyn Rong:  I discussed the critical findings in the CT head, cervical spine, chest, abdomen, and pelvis with doctor Alvira Monday at 4 o'clock p.m. on 01/30/2013 by telephone.   Electronically Signed   By: Herbie Baltimore   On: 01/30/2013 16:00   01/30/2013   CLINICAL DATA:  Fall from a ladder, 12 feet, loss of consciousness. Altered mental status.  EXAM: CT HEAD WITHOUT CONTRAST  CT CERVICAL SPINE WITHOUT CONTRAST  TECHNIQUE: Multidetector CT imaging of the head and cervical spine was performed following the standard protocol without intravenous contrast. Multiplanar CT image reconstructions of the cervical spine were also generated.  COMPARISON:  None.  FINDINGS: CT HEAD FINDINGS  Abnormal linear density along the inferior margin of the right temporal lobe on images 12-13 of series 3 is compatible with subarachnoid hemorrhage likely tracking along the sulcus. The brainstem and cerebellum appear unremarkable. Small hypodensity in the left globus pallidus nucleus may represent dilated perivascular space or a small remote lacunar infarct.  The ventricular system and basilar cisterns appear normal. No acute CVA or mass lesion.  Chronic maxillary, ethmoid, frontal, and sphenoid sinusitis.  CT  CERVICAL SPINE FINDINGS  Cervical spondylosis and degenerative disk disease noted with loss of disc height at C3-4, C5-6, and C6-7, and posterior osseous ridging at all levels between C3 and C7. Intervertebral and facet spurring causes osseous foraminal stenosis on the right at C3-4 and C6-7, and on the left at C3-4, C5-6, and C6-7.  No prevertebral soft tissue swelling is present. A broad central disc protrusion at C2-3 causes borderline central stenosis.  A hypodense right thyroid lobe lesion measures 2.0 x 1.6 cm.  No cervical spine fracture or acute subluxation is identified.  IMPRESSION: CT HEAD IMPRESSION  1. Small amount of acute subarachnoid hemorrhage along the inferior margin of the right temporal lobe. 2. Chronic paranasal sinusitis.  CT CERVICAL SPINE IMPRESSION  1. Cervical spondylosis and degenerative disc disease, causing multilevel foraminal impingement. 2. 2.0 cm right thyroid lobe lesion. Consider further evaluation with thyroid US. If patient is clinically hyperthyroid, consider nuclear medicine thyroid scan. 3. Broad central disc protrusion at C2-3 with borderline central stenosis.  Electronically Signed: By: Herbie Baltimore On: 01/30/2013 15:37   Ct Abdomen Pelvis W Contrast  01/30/2013   CLINICAL DATA:  Fall from a ladder, 12 feet  with loss of consciousness. Back pain.  EXAM: CT CHEST, ABDOMEN, AND PELVIS WITH CONTRAST  TECHNIQUE: Multidetector CT imaging of the chest, abdomen and pelvis was performed following the standard protocol during bolus administration of intravenous contrast.  CONTRAST:  OMNIPAQUE IOHEXOL 300 MG/ML  SOLN  COMPARISON:  Chest radiograph, 01/30/2013  FINDINGS: CT CHEST FINDINGS  Precarinal lymph node short axis 0.8 cm. Coronary artery atherosclerosis. Small to moderate-sized hiatal hernia. No significant pleural effusion.  Multiple bilateral rib fractures are present. These include the right 6th, 7th, and 8th ribs anterolaterally; the left 4th, 5th, 6th, 7th,  8th, and 9th ribs anterolaterally; and the left 7th, 8th, and 9th ribs posteromedially, with the left 9th posteromedial rib fracture displaced. This makes the left 7th, 8th, and 9th rib fractures segmental.  Dependent subsegmental atelectasis noted in both lower lobes. No pneumothorax observed. There is a 4 mm nodule along the minor fissure.  CT ABDOMEN AND PELVIS FINDINGS  Scattered fluid density lesions in the liver favor cysts although some are too small to characterize. The largest measures 1.7 cm in diameter in the right hepatic lobe on image 60 of series 4. No hepatic laceration or splenic laceration identified.  Adrenal glands and pancreas within normal limits. Gallbladder appears normal.  Right renal cysts noted. Several small lower pole cysts are present on the right.  There is hematoma overlying the right iliac bone in the subcutaneous tissues post for laterally, without overt hematoma in the gluteal musculature. There is a mixed density in the vicinity of the hematoma on image 86 of series 4, and a trace amount of subcutaneous active bleeding could not be readily excluded although the stranding in this vicinity is primarily infiltrative rather than masslike.  I do not see a lumbar spine fracture. Anterior epidural prominence in the lower lumbar spine is probably due to degenerative disc disease and venous plexus enhancement, although if the patient has significant neurologic abnormalities in the lower extremities then lumbar MRI may be warranted to exclude the unlikely possibility of epidural hematoma.  No pelvic fracture observed. Urinary bladder unremarkable.  IMPRESSION: CT CHEST IMPRESSION  1. Fractures of the right 6th, 7th, and 8th ribs. Fractures of the left 4th, 5th, and 6th ribs. Segmental fractures of the left 7th, 8th, and 9th ribs. 2. 4 mm nodule along the minor fissure. If the patient is at high risk for bronchogenic carcinoma, follow-up chest CT at 1year is recommended. If the patient is at  low risk, no follow-up is needed. This recommendation follows the consensus statement: Guidelines for Management of Small Pulmonary Nodules Detected on CT Scans: A Statement from the Fleischner Society as published in Radiology 2005; 237:395-400. 3. Small to moderate-sized hiatal hernia. 4. Coronary artery atherosclerosis  CT ABDOMEN AND PELVIS IMPRESSION  1. Subcutaneous hematoma projecting superficial to the right iliac bone and gluteus musculature. There may be a tiny amount of active subcutaneous bleeding during the scan, but the hematoma in this vicinity is primarily infiltrative rather than masslike. 2. Hepatic and renal cysts. 3. In anterior epidural prominence in the lower lumbar spine is likely due to venous plexus enhancement. If the patient has significant neurologic impairment in the lower extremities than lumbar MRI would be recommended to exclude the unlikely possibility of epidural hematoma.   Electronically Signed   By: Herbie Baltimore   On: 01/30/2013 15:52   Dg Chest Port 1 View  01/31/2013   *RADIOLOGY REPORT*  Clinical Data: Trauma, multiple rib fractures  PORTABLE CHEST -  1 VIEW  Comparison: Portable exam 0721 hours compared 01/30/2013  Findings: Upper normal heart size. Mediastinal contours and pulmonary vascularity normal. Bibasilar atelectasis. No definite infiltrate, pleural effusion or pneumothorax.  IMPRESSION: Bibasilar atelectasis.   Original Report Authenticated By: Ulyses Southward, M.D.   Dg Chest Portable 1 View  01/30/2013   CLINICAL DATA:  Fall from ladder.  EXAM: PORTABLE CHEST - 1 VIEW  COMPARISON:  None.  FINDINGS: There is a left lateral 7th rib fracture. Right lateral 6th rib fracture noted. No pneumothorax. Heart is normal size. Lungs are clear. No effusions.  IMPRESSION: Right 6th and left 7th rib fractures. No visible pneumothorax.   Electronically Signed   By: Charlett Nose   On: 01/30/2013 14:05   PE:  General appearance: alert, cooperative, appears stated age and no  distress  Eyes: conjunctivae/corneas clear. PERRL, EOM's intact. Fundi benign.  Resp: clear to auscultation bilaterally.  Chest wall tenderness. Cardio: regular rate and rhythm, S1, S2 normal, no murmur, click, rub or gallop  GI: soft, non-tender; bowel sounds normal; no masses, no organomegaly  Extremities: extremities normal, atraumatic, no cyanosis or edema.  SCDs on. Neurologic: Grossly normal   Patient Active Problem List   Diagnosis Date Noted  . Multiple rib fractures 01/30/2013  . Traumatic subarachnoid bleed with LOC of 30 minutes or less 01/30/2013   Assessment/Plan:  Fall from ladder  Right temporal SAH  -repeat CT of head-resolving  -PT/OT recommend home discharge, no additional services Right rib fractures 6-8 and left rib fractures 4-9  -aggressive pulmonary toilet(1085ml on IS, increased pain)  -poorly controlled pain, IVP dilaudid given, change tramadol to scheduled and PRN percocet -mobilize  Questionable underlining sleep apnea -desats at night 87-88% requiring 02 per [place].  Will need sleep study as outpatient VTE - SCD's, no lovenox, mobilize  HTN-continue home meds  FEN - tolerating diet Dispo --transfer to floor  Ashok Norris, ANP-BC Pager: [phone] General Trauma [place] Pager: [phone]   02/01/2013 7:32 AM

## 2013-02-01 NOTE — Progress Notes (Signed)
Occupational Therapy Treatment Patient Details Name: Raymond Ellison MRN: [id number] DOB: 1940-11-29 Today's Date: 02/01/2013 Time: 8657-8469 OT Time Calculation (min): 14 min  OT Assessment / Plan / Recommendation  History of present illness Pt admitted with right temporal SAH and right rib fx 6-8 and left rib fx 4-9 s/p fall from ladder. Pt is amnestic to event.   OT comments  Pt feeling much better today and is progressing toward goals.  Follow Up Recommendations  No OT follow up;Supervision/Assistance - 24 hour    Barriers to Discharge       Equipment Recommendations  Tub/shower seat    Recommendations for Other Services    Frequency Min 2X/week   Progress towards OT Goals Progress towards OT goals: Progressing toward goals  Plan Discharge plan remains appropriate    Precautions / Restrictions Precautions Precaution Comments: Multiple bil rib fx Restrictions Weight Bearing Restrictions: No   Pertinent Vitals/Pain See vitals    ADL  Upper Body Bathing: Simulated;Min guard Where Assessed - Upper Body Bathing: Unsupported standing Lower Body Bathing: Simulated;Min guard Where Assessed - Lower Body Bathing: Unsupported sit to stand Upper Body Dressing: Performed;Min guard Where Assessed - Upper Body Dressing: Unsupported standing Lower Body Dressing: Performed;Minimal assistance Where Assessed - Lower Body Dressing: Unsupported sit to stand Toilet Transfer: Simulated;Min guard Toilet Transfer Method: Sit to Barista:  (ambulating) Equipment Used: Gait belt Transfers/Ambulation Related to ADLs: Min guard for safety ADL Comments: Pt able to don gown to cover back with min guard while standing. Pt experiencing back discomfort when crossing legs in order to don/doff socks, therefore still requiring min assist for LB dressing.  Educated pt on use of reacher in order to retrieve ADL items independently and to assist with LB dressing. Pt's wife present  during session and reports she will be home to assist pt.    OT Diagnosis:    OT Problem List:   OT Treatment Interventions:     OT Goals(current goals can now be found in the care plan section) Acute Rehab OT Goals Patient Stated Goal: to return to being independent OT Goal Formulation: With patient Time For Goal Achievement: 02/07/13 Potential to Achieve Goals: Good ADL Goals Pt Will Perform Grooming: with modified independence;standing Pt Will Perform Upper Body Bathing: with set-up;sitting Pt Will Perform Lower Body Bathing: sit to/from stand;with supervision;with adaptive equipment Pt Will Perform Upper Body Dressing: with set-up;sitting Pt Will Perform Lower Body Dressing: with min assist;with adaptive equipment;sit to/from stand Pt Will Transfer to Toilet: with supervision;ambulating Pt Will Perform Toileting - Clothing Manipulation and hygiene: with supervision;sit to/from stand Pt Will Perform Tub/Shower Transfer: Tub transfer;with min guard assist;shower seat  Visit Information  Last OT Received On: 02/01/13 Assistance Needed: +1 History of Present Illness: Pt admitted with right temporal SAH and right rib fx 6-8 and left rib fx 4-9 s/p fall from ladder. Pt is amnestic to event.    Subjective Data      Prior Functioning       Cognition  Cognition Arousal/Alertness: Awake/alert Behavior During Therapy: WFL for tasks assessed/performed Overall Cognitive Status: Within Functional Limits for tasks assessed    Mobility  Bed Mobility Bed Mobility: Not assessed Details for Bed Mobility Assistance: Pt in chair on arrival Transfers Transfers: Sit to Stand;Stand to Sit Sit to Stand: 5: Supervision;From chair/3-in-1 Stand to Sit: 5: Supervision;To chair/3-in-1 Details for Transfer Assistance: cues for use of UEs and controlling descent to chair.      Exercises  Balance    End of Session OT - End of Session Equipment Utilized During Treatment: Gait  belt Activity Tolerance: Patient tolerated treatment well Patient left: in chair;with call bell/phone within reach;with family/visitor present Nurse Communication: Mobility status  GO    02/01/2013 Cipriano Mile OTR/L Pager [phone] Office [phone]  Cipriano Mile 02/01/2013, 2:27 PM

## 2013-02-01 NOTE — Progress Notes (Signed)
Doing well with therapies. Should progress toward home. Will recommend outpatient OSA work-up after D/C. To floor. Patient examined and I agree with the assessment and plan  Violeta Gelinas, MD, MPH, FACS Pager: [phone]  02/01/2013 7:58 AM

## 2013-02-01 NOTE — Progress Notes (Signed)
Physical Therapy Treatment Patient Details Name: Raymond Ellison MRN: [id number] DOB: August 28, 1940 Today's Date: 02/01/2013 Time: 1005-1029 PT Time Calculation (min): 24 min  PT Assessment / Plan / Recommendation  History of Present Illness Pt admitted with right temporal SAH and right rib fx 6-8 and left rib fx 4-9 s/p fall from ladder. Pt is amnestic to event.   PT Comments   Pt admitted with rib fxs and SAH. Pt currently with functional limitations due to the deficits listed below (see PT Problem List). Pt progressing.  Balance overall good.  Do not feel pt will need a device on d/c.   Pt will benefit from skilled PT to increase their independence and safety with mobility to allow discharge to the venue listed below.   Follow Up Recommendations  Home health PT;Supervision - Intermittent                 Equipment Recommendations  None recommended by PT        Frequency Min 5X/week   Progress towards PT Goals Progress towards PT goals: Progressing toward goals  Plan Current plan remains appropriate    Precautions / Restrictions Precautions Precaution Comments: Multiple bil rib fx Restrictions Weight Bearing Restrictions: No   Pertinent Vitals/Pain VSS, Some rib and back pain    Mobility  Bed Mobility Details for Bed Mobility Assistance: Pt in chair on arrival Transfers Transfers: Sit to Stand;Stand to Sit Sit to Stand: 4: Min assist;From bed;With upper extremity assist Stand to Sit: To chair/3-in-1;With armrests;With upper extremity assist;4: Min assist Details for Transfer Assistance: cues for use of UEs and controlling descent to chair.   Ambulation/Gait Ambulation/Gait Assistance: 4: Min guard Ambulation Distance (Feet): 350 Feet Assistive device: None Ambulation/Gait Assistance Details: Pt continues to move somewhat slowly and cautiously.  Overall good safety and steady.  No LOB with min challenges. Gait Pattern: Step-through pattern;Decreased stride length Stairs:  No Wheelchair Mobility Wheelchair Mobility: No    PT Goals (current goals can now be found in the care plan section)    Visit Information  Last PT Received On: 02/01/13 Assistance Needed: +1 History of Present Illness: Pt admitted with right temporal SAH and right rib fx 6-8 and left rib fx 4-9 s/p fall from ladder. Pt is amnestic to event.    Subjective Data  Subjective: "I feel better but have so much back and rib pain."   Cognition  Cognition Arousal/Alertness: Awake/alert Behavior During Therapy: WFL for tasks assessed/performed Overall Cognitive Status: Within Functional Limits for tasks assessed    Balance  Static Sitting Balance Static Sitting - Balance Support: No upper extremity supported;Feet supported Static Sitting - Level of Assistance: 5: Stand by assistance Static Standing Balance Static Standing - Balance Support: No upper extremity supported Static Standing - Level of Assistance: 5: Stand by assistance Static Standing - Comment/# of Minutes: 2 Standardized Balance Assessment Standardized Balance Assessment: Dynamic Gait Index Dynamic Gait Index Level Surface: Normal Change in Gait Speed: Normal Gait with Horizontal Head Turns: Normal Gait with Vertical Head Turns: Mild Impairment Gait and Pivot Turn: Normal Step Over Obstacle: Normal Step Around Obstacles: Normal Steps: Mild Impairment Total Score: 22  End of Session PT - End of Session Equipment Utilized During Treatment: Gait belt Activity Tolerance: Patient limited by pain Patient left: in chair;with call bell/phone within reach Nurse Communication: Mobility status        Raymond Ellison 02/01/2013, 12:16 PM  [facility] [phone] (pager)

## 2013-02-02 DIAGNOSIS — J45909 Unspecified asthma, uncomplicated: Secondary | ICD-10-CM | POA: Insufficient documentation

## 2013-02-02 DIAGNOSIS — W11XXXA Fall on and from ladder, initial encounter: Secondary | ICD-10-CM

## 2013-02-02 DIAGNOSIS — D62 Acute posthemorrhagic anemia: Secondary | ICD-10-CM | POA: Diagnosis not present

## 2013-02-02 DIAGNOSIS — K219 Gastro-esophageal reflux disease without esophagitis: Secondary | ICD-10-CM | POA: Insufficient documentation

## 2013-02-02 DIAGNOSIS — I10 Essential (primary) hypertension: Secondary | ICD-10-CM | POA: Insufficient documentation

## 2013-02-02 MED ORDER — POLYETHYLENE GLYCOL 3350 17 GM/SCOOP PO POWD: 17.0000 g | Freq: Every day | ORAL | Status: AC

## 2013-02-02 MED ORDER — DSS 100 MG PO CAPS: 200.0000 mg | ORAL_CAPSULE | Freq: Two times a day (BID) | ORAL | Status: AC

## 2013-02-02 MED ORDER — OXYCODONE-ACETAMINOPHEN 5-325 MG PO TABS: 1.0000 | ORAL_TABLET | ORAL | Status: AC | PRN

## 2013-02-02 NOTE — Progress Notes (Signed)
Spoke with patient about the tub transfer bench.  Stated he cannot use one right now due to his bathroom set up.  May purchase one in the future or borrow from his mother.   Orlan Leavens [phone]

## 2013-02-02 NOTE — Discharge Summary (Signed)
Dametri Ozburn, MD, MPH, FACS Pager: [phone]  

## 2013-02-02 NOTE — Progress Notes (Signed)
Plan D/C today Agree Violeta Gelinas, MD, MPH, FACS Pager: [phone]

## 2013-02-02 NOTE — Progress Notes (Signed)
Physical Therapy Treatment Patient Details Name: Raymond Ellison MRN: [id number] DOB: 12/23/1940 Today's Date: 02/02/2013 Time: 7829-5621 PT Time Calculation (min): 27 min  PT Assessment / Plan / Recommendation  History of Present Illness Pt admitted with right temporal SAH and right rib fx 6-8 and left rib fx 4-9 s/p fall from ladder. Pt is amnestic to event.   PT Comments   Pt moving well and demos good safety.  Feel pt is ready for D/C from PT standpoint.    Follow Up Recommendations  Home health PT;Supervision - Intermittent     Does the patient have the potential to tolerate intense rehabilitation     Barriers to Discharge        Equipment Recommendations  None recommended by PT    Recommendations for Other Services    Frequency Min 5X/week   Progress towards PT Goals Progress towards PT goals: Progressing toward goals  Plan Current plan remains appropriate    Precautions / Restrictions Precautions Precaution Comments: Multiple bil rib fx Restrictions Weight Bearing Restrictions: No   Pertinent Vitals/Pain "Grabs" in ribs with transfers and coughing.      Mobility  Bed Mobility Bed Mobility: Not assessed Transfers Transfers: Sit to Stand;Stand to Sit Sit to Stand: 5: Supervision;From chair/3-in-1 Stand to Sit: 5: Supervision;To chair/3-in-1 Details for Transfer Assistance: pt moves slowly and hugs pillow with one hand and uses other to A with sit to stand.   Ambulation/Gait Ambulation/Gait Assistance: 5: Supervision Ambulation Distance (Feet): 500 Feet Assistive device: None Ambulation/Gait Assistance Details: pt moves slowly, but demos good stability and ability to challenge balance with head turns, stops and turns.   Gait Pattern: Step-through pattern;Decreased stride length Stairs: Yes Stairs Assistance: 4: Min guard Stair Management Technique: One rail Left;Forwards Number of Stairs: 5 Wheelchair Mobility Wheelchair Mobility: No    Exercises     PT  Diagnosis:    PT Problem List:   PT Treatment Interventions:     PT Goals (current goals can now be found in the care plan section) Acute Rehab PT Goals Time For Goal Achievement: 02/14/13 Potential to Achieve Goals: Good  Visit Information  Last PT Received On: 02/02/13 Assistance Needed: +1 History of Present Illness: Pt admitted with right temporal SAH and right rib fx 6-8 and left rib fx 4-9 s/p fall from ladder. Pt is amnestic to event.    Subjective Data      Cognition  Cognition Arousal/Alertness: Awake/alert Behavior During Therapy: WFL for tasks assessed/performed Overall Cognitive Status: Within Functional Limits for tasks assessed    Balance  Balance Balance Assessed: Yes High Level Balance High Level Balance Activites: Head turns;Turns  End of Session PT - End of Session Equipment Utilized During Treatment: Gait belt Activity Tolerance: Patient tolerated treatment well Patient left: in chair;with call bell/phone within reach;with family/visitor present Nurse Communication: Mobility status   GP     Raymond Ellison, [place] [phone] 02/02/2013, 12:54 PM

## 2013-02-02 NOTE — Progress Notes (Signed)
Patient ID: Raymond Ellison, male   DOB: 1941-01-17, 72 y.o.   MRN: [id number]   LOS: 3 days   Subjective: No new c/o except aware he hasn't had BM but not uncomfortable.   Objective: Vital signs in last 24 hours: Temp:  [97.6 F (36.4 C)-99.7 F (37.6 C)] 97.6 F (36.4 C) (08/29 0529) Pulse Rate:  [50-79] 79 (08/29 0529) Resp:  [14-20] 17 (08/29 0529) BP: (110-152)/(51-70) 152/70 mmHg (08/29 0529) SpO2:  [93 %-100 %] 93 % (08/29 0529) Last BM Date: 01/30/13   IS:   Physical Exam General appearance: alert and no distress Resp: clear to auscultation bilaterally Cardio: regular rate and rhythm GI: normal findings: bowel sounds normal and soft, non-tender   Assessment/Plan: Fall from ladder  Right temporal SAH  Right rib fractures 6-8 and left rib fractures 4-9  Multiple medical problems -- Home meds Dispo --Home    Freeman Caldron, PA-C Pager: [phone] General Trauma PA Pager: [phone]   02/02/2013

## 2013-02-02 NOTE — Discharge Summary (Signed)
Physician Discharge Summary  Patient ID: Raymond Ellison MRN: [id number] DOB/AGE: 1940-07-21 72 y.o.  Admit date: 01/30/2013 Discharge date: 02/02/2013  Discharge Diagnoses Patient Active Problem List   Diagnosis Date Noted  . Fall from ladder 02/02/2013  . HTN (hypertension) 02/02/2013  . GERD (gastroesophageal reflux disease) 02/02/2013  . Asthma 02/02/2013  . Acute blood loss anemia 02/02/2013  . Multiple rib fractures 01/30/2013  . Traumatic subarachnoid bleed with LOC of 30 minutes or less 01/30/2013    Consultants Dr. Barnett Abu for neurosurgery   Procedures None   HPI: Raymond Ellison was up on a 12-foot ladder trimming limbs in his backyard. The ladder became unstable and he fell. This was witnessed by his wife. He had a brief loss of consciousness of a couple minutes. He was amnestic to the event. He was evaluated in the emergency department as a level II trauma. Workup included CT scans of the head, cervical spine, chest, abdomen, and pelvis and revealed the traumatic brain injury and bilateral rib fractures. He was admitted to the trauma ICU and neurosurgery was consulted.   Hospital Course: Neurosurgery recommended initial non-operative management and a repeat CT scan the following day was improved. Aside from the initial amnesia he had no neurologic deficits from the injury. His pain was controlled with oral medications for the most part and he was mobilized with physical and occupational therapies who recommended discharge home with home health. He had a very mild acute blood loss anemia that did not require transfusion. He was noted to have some mild hypoxia while sleeping and may benefit from a polysomnogram once his injuries have healed. He was discharged home in improved condition in the care of his wife.      Medication List         aspirin 81 MG tablet  Take 81 mg by mouth daily.     atenolol 50 MG tablet  Commonly known as:  TENORMIN  Take 50 mg by mouth daily.     DSS 100 MG Caps  Take 200 mg by mouth 2 (two) times daily.     fish oil-omega-3 fatty acids 1000 MG capsule  Take 2 g by mouth daily.     omeprazole 40 MG capsule  Commonly known as:  PRILOSEC  Take 40 mg by mouth daily.     oxyCODONE-acetaminophen 5-325 MG per tablet  Commonly known as:  PERCOCET/ROXICET  Take 1-2 tablets by mouth every 4 (four) hours as needed.     polyethylene glycol powder powder  Commonly known as:  MIRALAX  Take 17 g by mouth daily.             Follow-up Information   Schedule an appointment as soon as possible for a visit with Sissy Hoff, MD.   Specialty:  Family Medicine   Contact information:   [address] [place] [address] [phone]       Call [facility]. (As needed)    Contact information:   [address] [place] [address] [phone]       Signed: Freeman Caldron, PA-C Pager: [phone] General Trauma PA Pager: [phone]  02/02/2013, 8:05 AM

## 2013-02-02 NOTE — Progress Notes (Signed)
Home address is [address] [place], [place] [address].  Home phone number listed in EPIC is correct.  Pt chooses [facility] since his mother has used them in the past.

## 2013-02-02 NOTE — Progress Notes (Signed)
Pt discharged to home

## 2013-02-08 NOTE — ED Provider Notes (Signed)
I saw and evaluated the patient, reviewed the resident's note and I agree with the findings and plan.  See completed note.   Raeford Razor, MD 02/08/13 [phone]

## 2014-03-04 ENCOUNTER — Other Ambulatory Visit: Payer: Self-pay | Admitting: Family Medicine

## 2014-03-04 DIAGNOSIS — Z87891 Personal history of nicotine dependence: Secondary | ICD-10-CM

## 2014-03-12 ENCOUNTER — Ambulatory Visit
Admission: RE | Admit: 2014-03-12 | Discharge: 2014-03-12 | Disposition: A | Discharge status: Home / Self Care | Payer: Medicare Other | Source: Ambulatory Visit | Attending: Family Medicine | Admitting: Family Medicine

## 2014-03-12 ENCOUNTER — Encounter (INDEPENDENT_AMBULATORY_CARE_PROVIDER_SITE_OTHER): Payer: Self-pay

## 2014-03-12 DIAGNOSIS — Z87891 Personal history of nicotine dependence: Secondary | ICD-10-CM

## 2014-12-02 ENCOUNTER — Other Ambulatory Visit: Payer: Self-pay

## 2015-04-13 IMAGING — CT CT HEAD W/O CM
1 series · 15 of 30 positions shown, 19 images · non-contrast
Comparison: 01/30/2013.

CLINICAL DATA: Traumatic subarachnoid hemorrhage. Subsequent
evaluation.  Follow-up.

CT HEAD WITHOUT CONTRAST
TECHNIQUE: Contiguous axial images were obtained from the base of
the skull through the vertex without contrast.

[Series 2: head 5.0 h30s · axial · 0.45mm/px · z∈[-230,-95]mm · 15 of 31 slices shown, 19 images]
[im 2/31  brain]
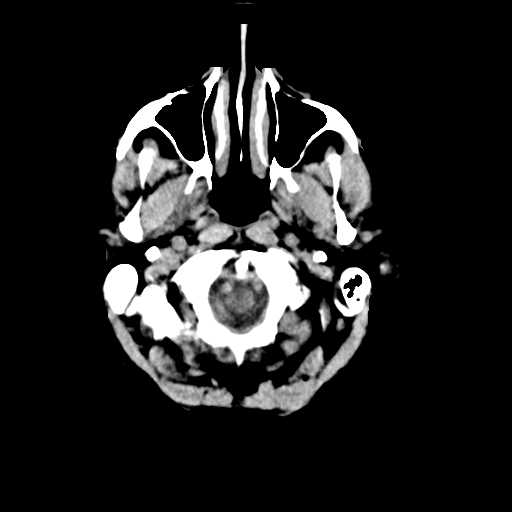
[im 2/31  bone]
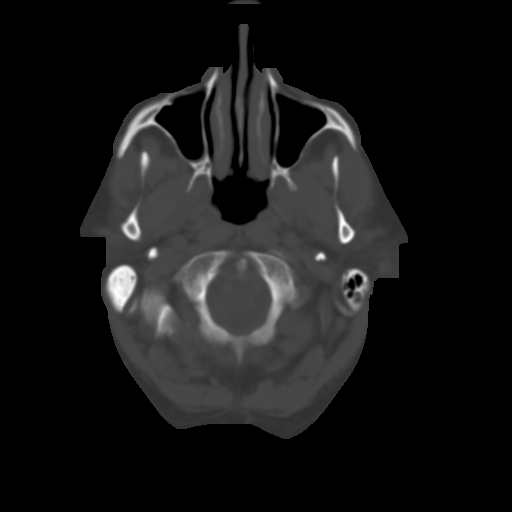
[im 4/31  brain]
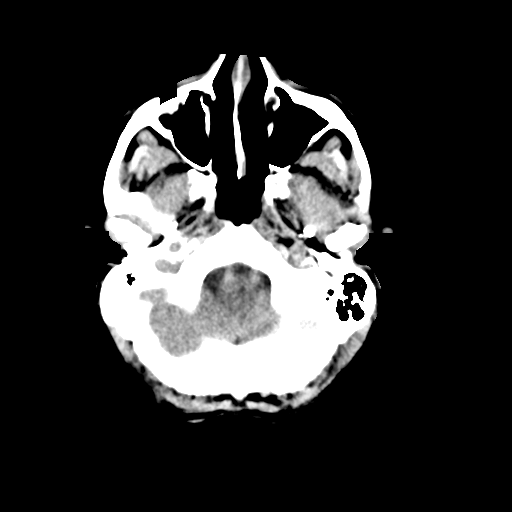
[im 6/31  brain]
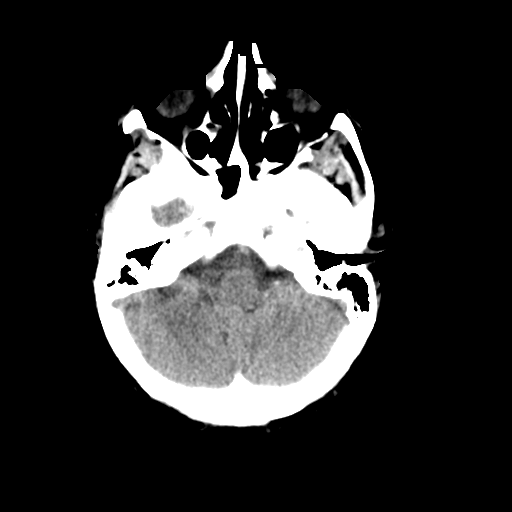
[im 8/31  brain]
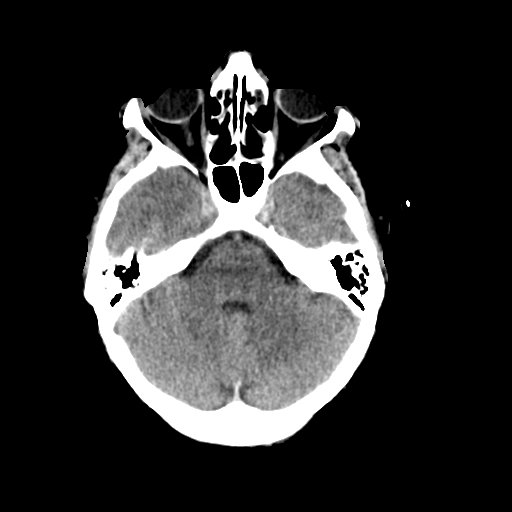
[im 10/31  brain]
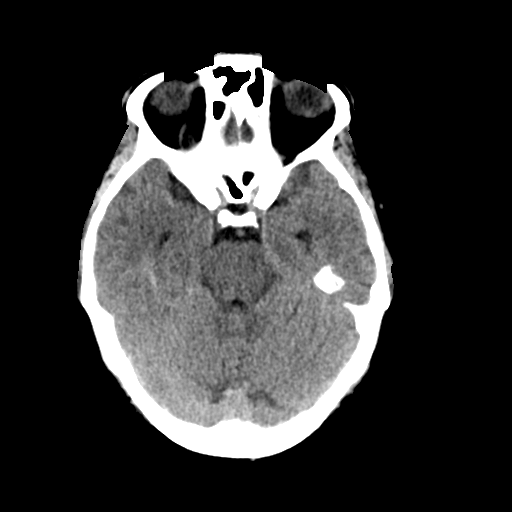
[im 10/31  bone]
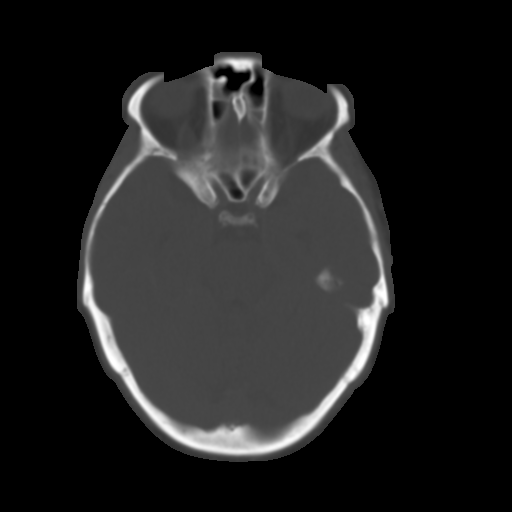
[im 12/31  brain]
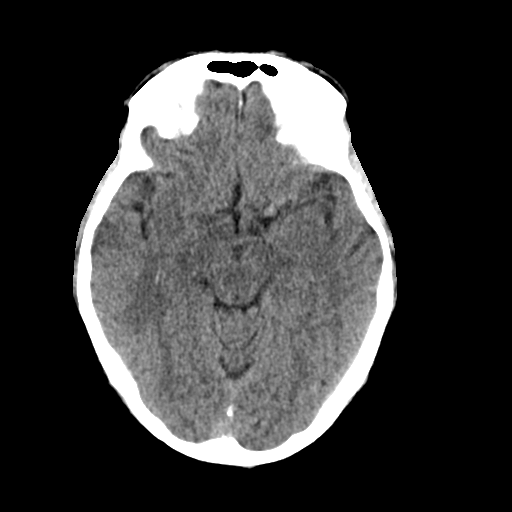
[im 14/31  brain]
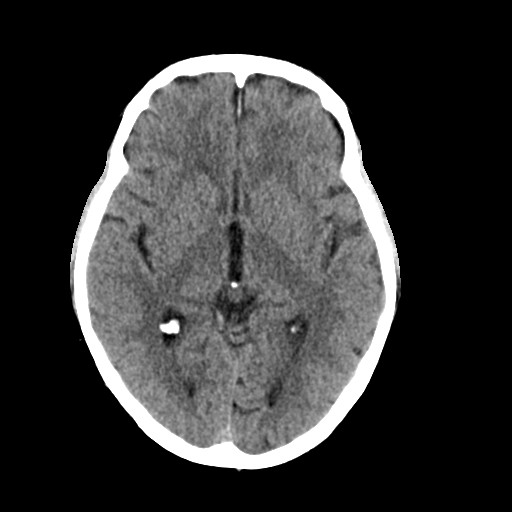
[im 16/31  brain]
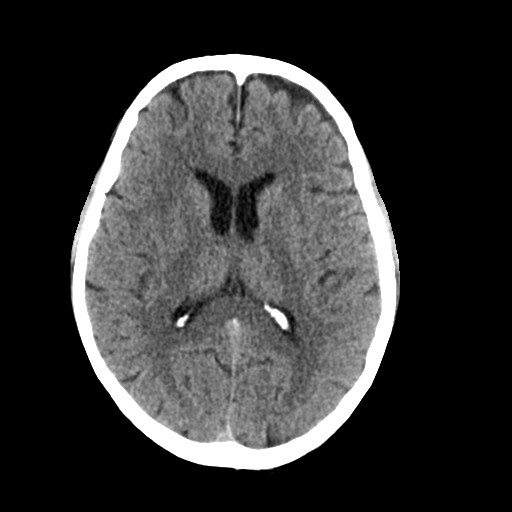
[im 17/31  brain]
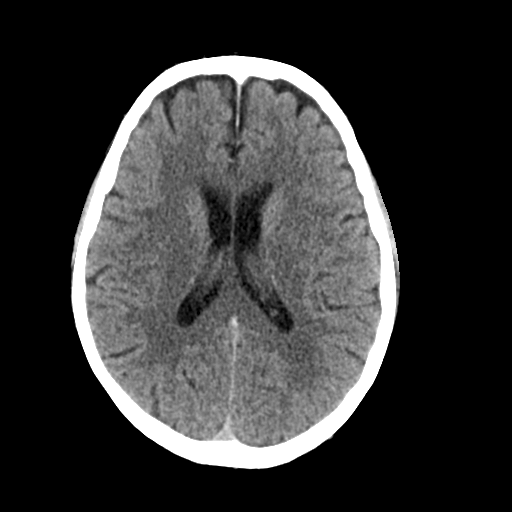
[im 17/31  bone]
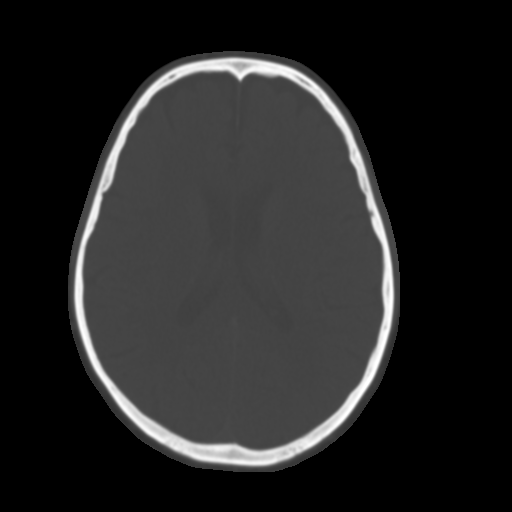
[im 19/31  brain]
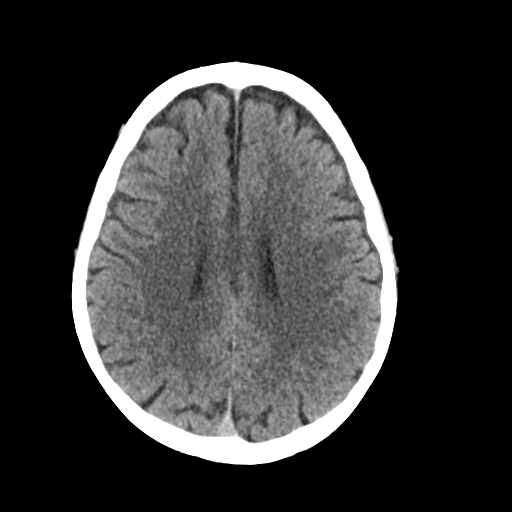
[im 21/31  brain]
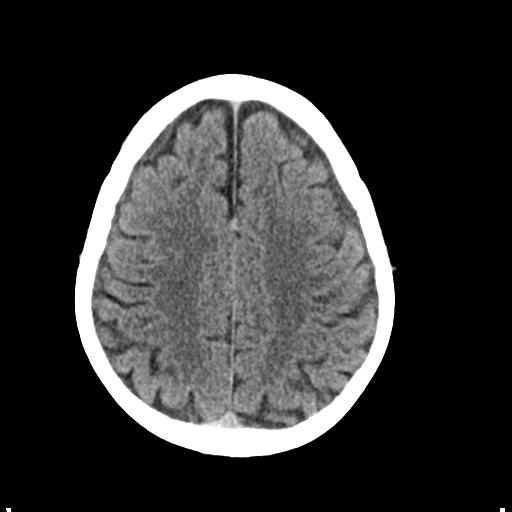
[im 23/31  brain]
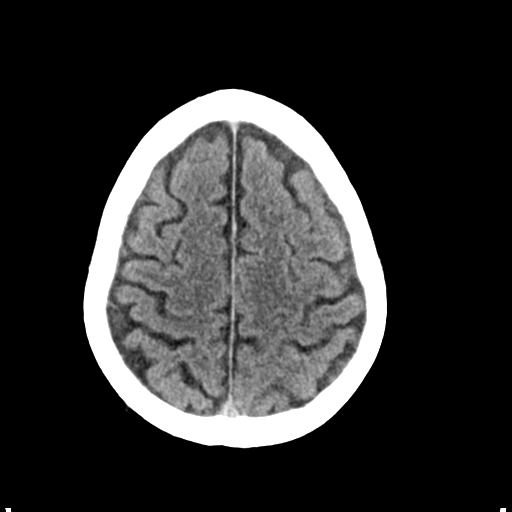
[im 25/31  brain]
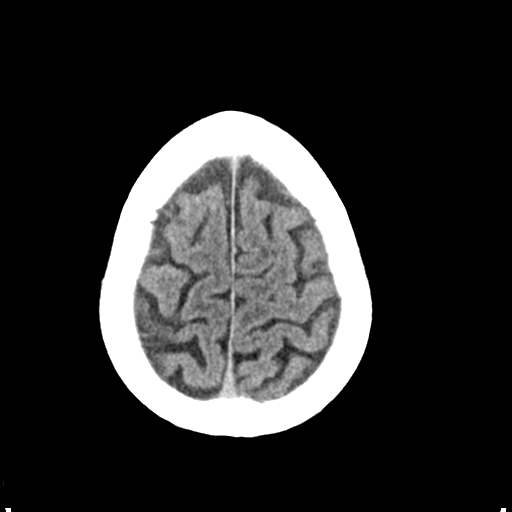
[im 25/31  bone]
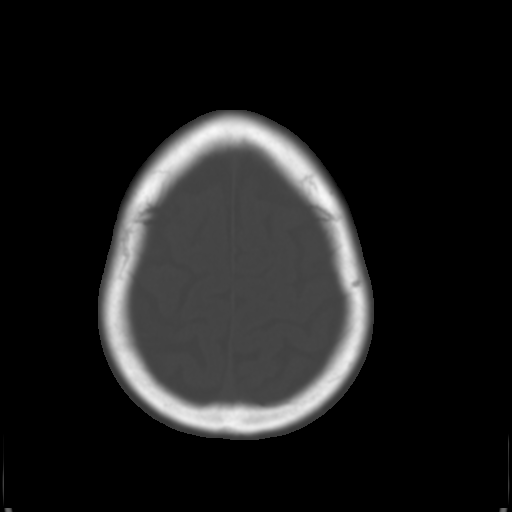
[im 27/31  brain]
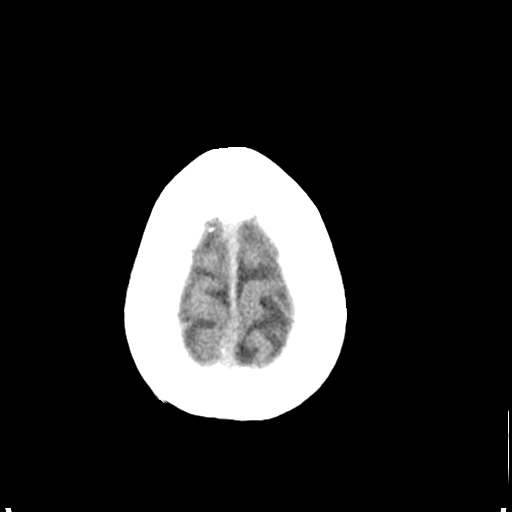
[im 29/31  brain]
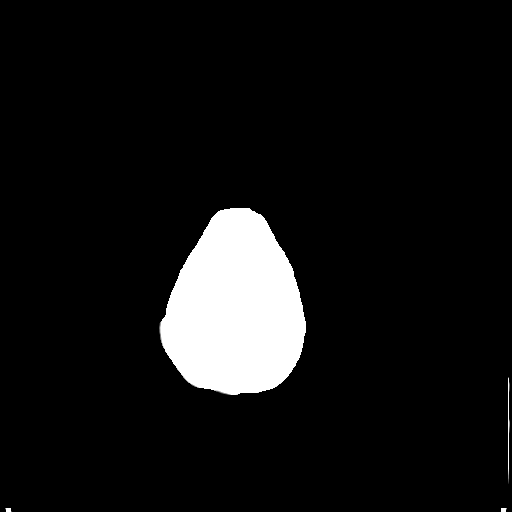

[15 of 30 positions shown; findings below may reference images not displayed]

FINDINGS: The right temporal subarachnoid hemorrhage remains
visible although the conspicuity has diminished significantly
compared to prior with near resolution.  There is no hydrocephalus.
There is no blood in the suprasellar cistern or intercondylar
notch.  Ethmoid air cell mucosal thickening is present.  Mastoid
air cells clear.  Calvarium intact.  Posterior fossa structures
appear normal.  No mass lesion, midline shift or evidence of acute
infarct. High attenuation is present along the inferior aspect of
the falx most compatible with the inferior sagittal sinus rather
than subdural blood. Debris is present in both external auditory
canals.
IMPRESSION: 1.  Resolving right temporal subarachnoid hemorrhage.
2.  Negative for hydrocephalus or other acute intracranial
abnormality.
3.  Paranasal sinus disease.

## 2015-08-15 DIAGNOSIS — J209 Acute bronchitis, unspecified: Secondary | ICD-10-CM | POA: Diagnosis not present

## 2015-09-22 DIAGNOSIS — G43909 Migraine, unspecified, not intractable, without status migrainosus: Secondary | ICD-10-CM | POA: Diagnosis not present

## 2015-09-22 DIAGNOSIS — M129 Arthropathy, unspecified: Secondary | ICD-10-CM | POA: Diagnosis not present

## 2015-09-22 DIAGNOSIS — Z1389 Encounter for screening for other disorder: Secondary | ICD-10-CM | POA: Diagnosis not present

## 2015-09-22 DIAGNOSIS — E785 Hyperlipidemia, unspecified: Secondary | ICD-10-CM | POA: Diagnosis not present

## 2015-09-22 DIAGNOSIS — J45909 Unspecified asthma, uncomplicated: Secondary | ICD-10-CM | POA: Diagnosis not present

## 2015-09-22 DIAGNOSIS — R7303 Prediabetes: Secondary | ICD-10-CM | POA: Diagnosis not present

## 2015-09-22 DIAGNOSIS — Z131 Encounter for screening for diabetes mellitus: Secondary | ICD-10-CM | POA: Diagnosis not present

## 2015-09-22 DIAGNOSIS — K219 Gastro-esophageal reflux disease without esophagitis: Secondary | ICD-10-CM | POA: Diagnosis not present

## 2015-09-22 DIAGNOSIS — N529 Male erectile dysfunction, unspecified: Secondary | ICD-10-CM | POA: Diagnosis not present

## 2015-09-22 DIAGNOSIS — I10 Essential (primary) hypertension: Secondary | ICD-10-CM | POA: Diagnosis not present

## 2016-01-01 DIAGNOSIS — R300 Dysuria: Secondary | ICD-10-CM | POA: Diagnosis not present

## 2016-01-01 DIAGNOSIS — R5383 Other fatigue: Secondary | ICD-10-CM | POA: Diagnosis not present

## 2016-01-01 DIAGNOSIS — R6883 Chills (without fever): Secondary | ICD-10-CM | POA: Diagnosis not present

## 2016-01-01 DIAGNOSIS — N3001 Acute cystitis with hematuria: Secondary | ICD-10-CM | POA: Diagnosis not present

## 2016-03-02 DIAGNOSIS — Z23 Encounter for immunization: Secondary | ICD-10-CM | POA: Diagnosis not present

## 2016-03-22 DIAGNOSIS — K219 Gastro-esophageal reflux disease without esophagitis: Secondary | ICD-10-CM | POA: Diagnosis not present

## 2016-03-22 DIAGNOSIS — G43909 Migraine, unspecified, not intractable, without status migrainosus: Secondary | ICD-10-CM | POA: Diagnosis not present

## 2016-03-22 DIAGNOSIS — E785 Hyperlipidemia, unspecified: Secondary | ICD-10-CM | POA: Diagnosis not present

## 2016-03-22 DIAGNOSIS — I10 Essential (primary) hypertension: Secondary | ICD-10-CM | POA: Diagnosis not present

## 2016-03-22 DIAGNOSIS — J45909 Unspecified asthma, uncomplicated: Secondary | ICD-10-CM | POA: Diagnosis not present

## 2016-03-22 DIAGNOSIS — R7301 Impaired fasting glucose: Secondary | ICD-10-CM | POA: Diagnosis not present

## 2016-03-22 DIAGNOSIS — M129 Arthropathy, unspecified: Secondary | ICD-10-CM | POA: Diagnosis not present

## 2016-03-22 DIAGNOSIS — N529 Male erectile dysfunction, unspecified: Secondary | ICD-10-CM | POA: Diagnosis not present

## 2016-05-22 IMAGING — US US AORTA SCREENING (MEDICARE)
1 series · 1 of 1 positions shown · non-contrast
Comparison: CT abdomen pelvis of 01/30/2013

CLINICAL DATA: Former smoking history, screening for abdominal
aortic aneurysm

EXAM:
ABDOMINAL AORTA SCREENING ULTRASOUND
TECHNIQUE: Ultrasound examination of the abdominal aorta was performed as a
screening evaluation for abdominal aortic aneurysm.

[Series 1: us aorta screening (medicare) · 0.17mm/px · 1 of 1 slices shown]
[im 1/1]
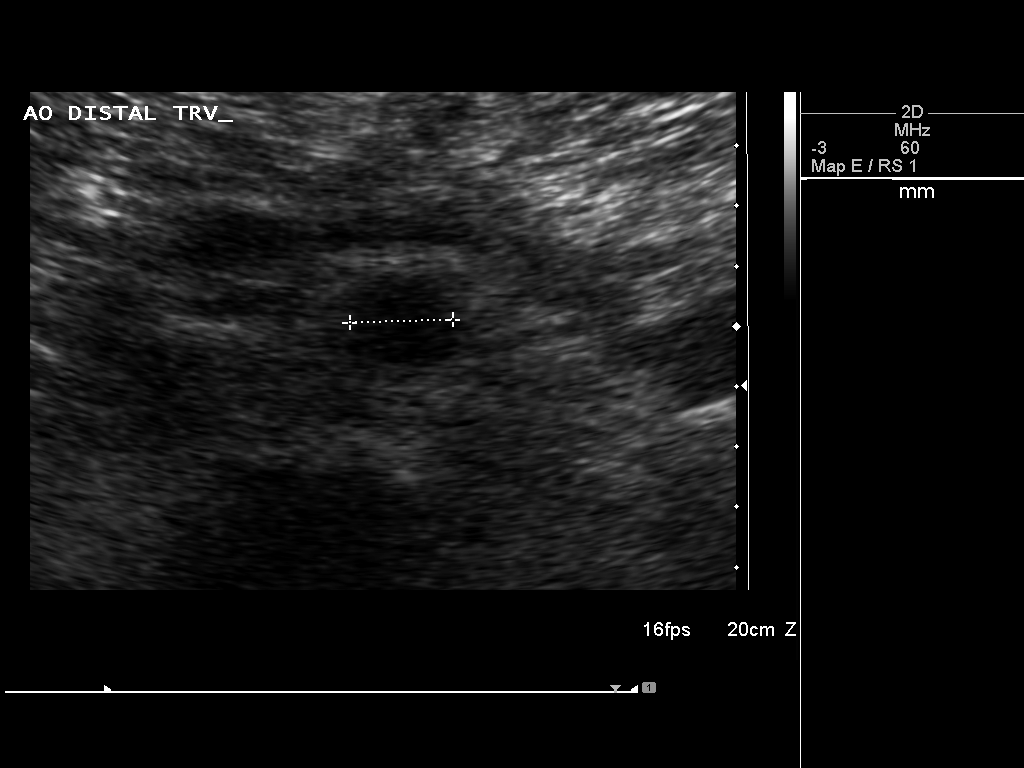

[1 of 1 positions shown; findings below may reference images not displayed]

FINDINGS: Abdominal Aorta

No abdominal aortic aneurysm is seen. The proximal abdominal aorta
measures 2.2 x 2.7 cm. The mid abdominal aorta measures 2.0 x
cm, with the distal abdominal aorta measuring 1.6 x 1.7 cm. The
common iliac arteries appear normal with the right common iliac
artery measuring 11 mm in diameter, as does the left common iliac
artery.

Maximum AP

Diameter:  2.2 cm proximally.

Maximum TRV

Diameter: 2.7 cm proximally.
IMPRESSION: No abdominal aortic aneurysm is seen.

## 2016-06-03 DIAGNOSIS — M25562 Pain in left knee: Secondary | ICD-10-CM | POA: Diagnosis not present

## 2016-06-11 DIAGNOSIS — M25562 Pain in left knee: Secondary | ICD-10-CM | POA: Diagnosis not present

## 2016-06-18 DIAGNOSIS — M25562 Pain in left knee: Secondary | ICD-10-CM | POA: Diagnosis not present

## 2016-06-18 DIAGNOSIS — S83232D Complex tear of medial meniscus, current injury, left knee, subsequent encounter: Secondary | ICD-10-CM | POA: Diagnosis not present

## 2016-07-06 DIAGNOSIS — S83232A Complex tear of medial meniscus, current injury, left knee, initial encounter: Secondary | ICD-10-CM | POA: Diagnosis not present

## 2016-07-06 DIAGNOSIS — M23222 Derangement of posterior horn of medial meniscus due to old tear or injury, left knee: Secondary | ICD-10-CM | POA: Diagnosis not present

## 2016-07-06 DIAGNOSIS — G8918 Other acute postprocedural pain: Secondary | ICD-10-CM | POA: Diagnosis not present

## 2016-07-06 DIAGNOSIS — M23322 Other meniscus derangements, posterior horn of medial meniscus, left knee: Secondary | ICD-10-CM | POA: Diagnosis not present

## 2016-07-22 DIAGNOSIS — Z23 Encounter for immunization: Secondary | ICD-10-CM | POA: Diagnosis not present

## 2016-07-22 DIAGNOSIS — L821 Other seborrheic keratosis: Secondary | ICD-10-CM | POA: Diagnosis not present

## 2016-07-22 DIAGNOSIS — D1801 Hemangioma of skin and subcutaneous tissue: Secondary | ICD-10-CM | POA: Diagnosis not present

## 2016-07-22 DIAGNOSIS — L814 Other melanin hyperpigmentation: Secondary | ICD-10-CM | POA: Diagnosis not present

## 2016-09-27 DIAGNOSIS — I10 Essential (primary) hypertension: Secondary | ICD-10-CM | POA: Diagnosis not present

## 2016-09-27 DIAGNOSIS — G43909 Migraine, unspecified, not intractable, without status migrainosus: Secondary | ICD-10-CM | POA: Diagnosis not present

## 2016-09-27 DIAGNOSIS — Z0000 Encounter for general adult medical examination without abnormal findings: Secondary | ICD-10-CM | POA: Diagnosis not present

## 2016-09-27 DIAGNOSIS — K219 Gastro-esophageal reflux disease without esophagitis: Secondary | ICD-10-CM | POA: Diagnosis not present

## 2017-03-15 DIAGNOSIS — M1712 Unilateral primary osteoarthritis, left knee: Secondary | ICD-10-CM | POA: Diagnosis not present

## 2017-03-15 DIAGNOSIS — S83232D Complex tear of medial meniscus, current injury, left knee, subsequent encounter: Secondary | ICD-10-CM | POA: Diagnosis not present

## 2017-03-29 DIAGNOSIS — K219 Gastro-esophageal reflux disease without esophagitis: Secondary | ICD-10-CM | POA: Diagnosis not present

## 2017-03-29 DIAGNOSIS — G43909 Migraine, unspecified, not intractable, without status migrainosus: Secondary | ICD-10-CM | POA: Diagnosis not present

## 2017-03-29 DIAGNOSIS — I10 Essential (primary) hypertension: Secondary | ICD-10-CM | POA: Diagnosis not present

## 2017-03-29 DIAGNOSIS — E782 Mixed hyperlipidemia: Secondary | ICD-10-CM | POA: Diagnosis not present

## 2017-06-23 DIAGNOSIS — M238X2 Other internal derangements of left knee: Secondary | ICD-10-CM | POA: Diagnosis not present

## 2017-07-14 DIAGNOSIS — S83242D Other tear of medial meniscus, current injury, left knee, subsequent encounter: Secondary | ICD-10-CM | POA: Diagnosis not present

## 2017-07-14 DIAGNOSIS — M25562 Pain in left knee: Secondary | ICD-10-CM | POA: Diagnosis not present

## 2017-07-26 DIAGNOSIS — L814 Other melanin hyperpigmentation: Secondary | ICD-10-CM | POA: Diagnosis not present

## 2017-07-26 DIAGNOSIS — D1801 Hemangioma of skin and subcutaneous tissue: Secondary | ICD-10-CM | POA: Diagnosis not present

## 2017-07-26 DIAGNOSIS — L821 Other seborrheic keratosis: Secondary | ICD-10-CM | POA: Diagnosis not present

## 2017-07-26 DIAGNOSIS — L570 Actinic keratosis: Secondary | ICD-10-CM | POA: Diagnosis not present

## 2017-08-23 DIAGNOSIS — G8918 Other acute postprocedural pain: Secondary | ICD-10-CM | POA: Diagnosis not present

## 2017-08-23 DIAGNOSIS — S83242A Other tear of medial meniscus, current injury, left knee, initial encounter: Secondary | ICD-10-CM | POA: Diagnosis not present

## 2017-08-23 DIAGNOSIS — M23222 Derangement of posterior horn of medial meniscus due to old tear or injury, left knee: Secondary | ICD-10-CM | POA: Diagnosis not present

## 2017-09-30 DIAGNOSIS — G43909 Migraine, unspecified, not intractable, without status migrainosus: Secondary | ICD-10-CM | POA: Diagnosis not present

## 2017-09-30 DIAGNOSIS — Z0000 Encounter for general adult medical examination without abnormal findings: Secondary | ICD-10-CM | POA: Diagnosis not present

## 2017-09-30 DIAGNOSIS — I10 Essential (primary) hypertension: Secondary | ICD-10-CM | POA: Diagnosis not present

## 2017-09-30 DIAGNOSIS — K219 Gastro-esophageal reflux disease without esophagitis: Secondary | ICD-10-CM | POA: Diagnosis not present

## 2017-10-28 DIAGNOSIS — M25662 Stiffness of left knee, not elsewhere classified: Secondary | ICD-10-CM | POA: Diagnosis not present

## 2017-10-28 DIAGNOSIS — M25562 Pain in left knee: Secondary | ICD-10-CM | POA: Diagnosis not present

## 2017-12-16 DIAGNOSIS — Z86010 Personal history of colonic polyps: Secondary | ICD-10-CM | POA: Diagnosis not present

## 2017-12-16 DIAGNOSIS — D126 Benign neoplasm of colon, unspecified: Secondary | ICD-10-CM | POA: Diagnosis not present

## 2017-12-16 DIAGNOSIS — K5730 Diverticulosis of large intestine without perforation or abscess without bleeding: Secondary | ICD-10-CM | POA: Diagnosis not present

## 2017-12-20 DIAGNOSIS — D126 Benign neoplasm of colon, unspecified: Secondary | ICD-10-CM | POA: Diagnosis not present

## 2018-03-31 DIAGNOSIS — K219 Gastro-esophageal reflux disease without esophagitis: Secondary | ICD-10-CM | POA: Diagnosis not present

## 2018-03-31 DIAGNOSIS — G43909 Migraine, unspecified, not intractable, without status migrainosus: Secondary | ICD-10-CM | POA: Diagnosis not present

## 2018-03-31 DIAGNOSIS — E782 Mixed hyperlipidemia: Secondary | ICD-10-CM | POA: Diagnosis not present

## 2018-03-31 DIAGNOSIS — I10 Essential (primary) hypertension: Secondary | ICD-10-CM | POA: Diagnosis not present

## 2018-06-23 DIAGNOSIS — M1712 Unilateral primary osteoarthritis, left knee: Secondary | ICD-10-CM | POA: Diagnosis not present

## 2018-06-23 DIAGNOSIS — M7052 Other bursitis of knee, left knee: Secondary | ICD-10-CM | POA: Diagnosis not present

## 2018-06-23 DIAGNOSIS — M25562 Pain in left knee: Secondary | ICD-10-CM | POA: Diagnosis not present

## 2018-07-06 DIAGNOSIS — J019 Acute sinusitis, unspecified: Secondary | ICD-10-CM | POA: Diagnosis not present

## 2018-10-20 DIAGNOSIS — M1711 Unilateral primary osteoarthritis, right knee: Secondary | ICD-10-CM | POA: Diagnosis not present

## 2018-10-20 DIAGNOSIS — M1712 Unilateral primary osteoarthritis, left knee: Secondary | ICD-10-CM | POA: Diagnosis not present

## 2018-10-20 DIAGNOSIS — M7052 Other bursitis of knee, left knee: Secondary | ICD-10-CM | POA: Diagnosis not present

## 2018-11-03 DIAGNOSIS — Z0000 Encounter for general adult medical examination without abnormal findings: Secondary | ICD-10-CM | POA: Diagnosis not present

## 2018-11-03 DIAGNOSIS — E782 Mixed hyperlipidemia: Secondary | ICD-10-CM | POA: Diagnosis not present

## 2018-11-03 DIAGNOSIS — K219 Gastro-esophageal reflux disease without esophagitis: Secondary | ICD-10-CM | POA: Diagnosis not present

## 2018-11-03 DIAGNOSIS — I10 Essential (primary) hypertension: Secondary | ICD-10-CM | POA: Diagnosis not present

## 2018-11-15 DIAGNOSIS — E782 Mixed hyperlipidemia: Secondary | ICD-10-CM | POA: Diagnosis not present

## 2018-11-15 DIAGNOSIS — R7303 Prediabetes: Secondary | ICD-10-CM | POA: Diagnosis not present

## 2018-11-29 DIAGNOSIS — L570 Actinic keratosis: Secondary | ICD-10-CM | POA: Diagnosis not present

## 2018-11-29 DIAGNOSIS — D225 Melanocytic nevi of trunk: Secondary | ICD-10-CM | POA: Diagnosis not present

## 2018-11-29 DIAGNOSIS — L821 Other seborrheic keratosis: Secondary | ICD-10-CM | POA: Diagnosis not present

## 2018-11-29 DIAGNOSIS — L814 Other melanin hyperpigmentation: Secondary | ICD-10-CM | POA: Diagnosis not present

## 2019-01-08 DIAGNOSIS — Z0120 Encounter for dental examination and cleaning without abnormal findings: Secondary | ICD-10-CM | POA: Diagnosis not present

## 2019-03-06 ENCOUNTER — Other Ambulatory Visit: Payer: Self-pay

## 2019-03-06 DIAGNOSIS — Z20822 Contact with and (suspected) exposure to covid-19: Secondary | ICD-10-CM

## 2019-03-07 LAB — NOVEL CORONAVIRUS, NAA: SARS-CoV-2, NAA: NOT DETECTED

## 2019-03-21 DIAGNOSIS — Z23 Encounter for immunization: Secondary | ICD-10-CM | POA: Diagnosis not present

## 2019-04-26 DIAGNOSIS — J4520 Mild intermittent asthma, uncomplicated: Secondary | ICD-10-CM | POA: Diagnosis not present

## 2019-04-26 DIAGNOSIS — N183 Chronic kidney disease, stage 3 unspecified: Secondary | ICD-10-CM | POA: Diagnosis not present

## 2019-04-26 DIAGNOSIS — E782 Mixed hyperlipidemia: Secondary | ICD-10-CM | POA: Diagnosis not present

## 2019-04-26 DIAGNOSIS — I10 Essential (primary) hypertension: Secondary | ICD-10-CM | POA: Diagnosis not present

## 2019-05-08 DIAGNOSIS — G43909 Migraine, unspecified, not intractable, without status migrainosus: Secondary | ICD-10-CM | POA: Diagnosis not present

## 2019-05-08 DIAGNOSIS — I10 Essential (primary) hypertension: Secondary | ICD-10-CM | POA: Diagnosis not present

## 2019-05-08 DIAGNOSIS — N1831 Chronic kidney disease, stage 3a: Secondary | ICD-10-CM | POA: Diagnosis not present

## 2019-05-08 DIAGNOSIS — K219 Gastro-esophageal reflux disease without esophagitis: Secondary | ICD-10-CM | POA: Diagnosis not present

## 2019-06-16 ENCOUNTER — Ambulatory Visit: Payer: Self-pay

## 2019-06-16 NOTE — Telephone Encounter (Signed)
Pt advised that he can take the vaccine as soon as it is offered.  Reason for Disposition . General information question, no triage required and triager able to answer question  Answer Assessment - Initial Assessment Questions 1. REASON FOR CALL or QUESTION: "What is your reason for calling today?" or "How can I best help you?" or "What question do you have that I can help answer?"     Covid vaccination  Protocols used: INFORMATION ONLY CALL - NO TRIAGE-A-AH

## 2019-07-11 DIAGNOSIS — I10 Essential (primary) hypertension: Secondary | ICD-10-CM | POA: Diagnosis not present

## 2019-07-11 DIAGNOSIS — J4520 Mild intermittent asthma, uncomplicated: Secondary | ICD-10-CM | POA: Diagnosis not present

## 2019-07-11 DIAGNOSIS — N183 Chronic kidney disease, stage 3 unspecified: Secondary | ICD-10-CM | POA: Diagnosis not present

## 2019-07-11 DIAGNOSIS — Z0120 Encounter for dental examination and cleaning without abnormal findings: Secondary | ICD-10-CM | POA: Diagnosis not present

## 2019-07-11 DIAGNOSIS — E782 Mixed hyperlipidemia: Secondary | ICD-10-CM | POA: Diagnosis not present

## 2019-09-24 DIAGNOSIS — J45909 Unspecified asthma, uncomplicated: Secondary | ICD-10-CM | POA: Diagnosis not present

## 2019-09-24 DIAGNOSIS — E782 Mixed hyperlipidemia: Secondary | ICD-10-CM | POA: Diagnosis not present

## 2019-09-24 DIAGNOSIS — J4520 Mild intermittent asthma, uncomplicated: Secondary | ICD-10-CM | POA: Diagnosis not present

## 2019-09-24 DIAGNOSIS — I10 Essential (primary) hypertension: Secondary | ICD-10-CM | POA: Diagnosis not present

## 2019-10-11 DIAGNOSIS — I10 Essential (primary) hypertension: Secondary | ICD-10-CM | POA: Diagnosis not present

## 2019-10-11 DIAGNOSIS — W57XXXA Bitten or stung by nonvenomous insect and other nonvenomous arthropods, initial encounter: Secondary | ICD-10-CM | POA: Diagnosis not present

## 2019-10-11 DIAGNOSIS — S70361A Insect bite (nonvenomous), right thigh, initial encounter: Secondary | ICD-10-CM | POA: Diagnosis not present

## 2019-10-11 DIAGNOSIS — M5431 Sciatica, right side: Secondary | ICD-10-CM | POA: Diagnosis not present

## 2019-11-09 DIAGNOSIS — M5431 Sciatica, right side: Secondary | ICD-10-CM | POA: Diagnosis not present

## 2019-11-09 DIAGNOSIS — Z0000 Encounter for general adult medical examination without abnormal findings: Secondary | ICD-10-CM | POA: Diagnosis not present

## 2019-11-09 DIAGNOSIS — E782 Mixed hyperlipidemia: Secondary | ICD-10-CM | POA: Diagnosis not present

## 2019-11-09 DIAGNOSIS — M26609 Unspecified temporomandibular joint disorder, unspecified side: Secondary | ICD-10-CM | POA: Diagnosis not present

## 2019-11-09 DIAGNOSIS — N529 Male erectile dysfunction, unspecified: Secondary | ICD-10-CM | POA: Diagnosis not present

## 2019-11-14 ENCOUNTER — Other Ambulatory Visit: Payer: Self-pay | Admitting: Family Medicine

## 2019-11-14 DIAGNOSIS — M5431 Sciatica, right side: Secondary | ICD-10-CM

## 2019-12-03 DIAGNOSIS — L578 Other skin changes due to chronic exposure to nonionizing radiation: Secondary | ICD-10-CM | POA: Diagnosis not present

## 2019-12-03 DIAGNOSIS — D485 Neoplasm of uncertain behavior of skin: Secondary | ICD-10-CM | POA: Diagnosis not present

## 2019-12-03 DIAGNOSIS — C44319 Basal cell carcinoma of skin of other parts of face: Secondary | ICD-10-CM | POA: Diagnosis not present

## 2019-12-03 DIAGNOSIS — L821 Other seborrheic keratosis: Secondary | ICD-10-CM | POA: Diagnosis not present

## 2019-12-03 DIAGNOSIS — D0439 Carcinoma in situ of skin of other parts of face: Secondary | ICD-10-CM | POA: Diagnosis not present

## 2019-12-03 DIAGNOSIS — D225 Melanocytic nevi of trunk: Secondary | ICD-10-CM | POA: Diagnosis not present

## 2019-12-03 DIAGNOSIS — L814 Other melanin hyperpigmentation: Secondary | ICD-10-CM | POA: Diagnosis not present

## 2019-12-17 ENCOUNTER — Other Ambulatory Visit: Payer: Self-pay

## 2019-12-24 DIAGNOSIS — D0439 Carcinoma in situ of skin of other parts of face: Secondary | ICD-10-CM | POA: Diagnosis not present

## 2019-12-31 DIAGNOSIS — C44319 Basal cell carcinoma of skin of other parts of face: Secondary | ICD-10-CM | POA: Diagnosis not present

## 2020-01-08 DIAGNOSIS — Z0120 Encounter for dental examination and cleaning without abnormal findings: Secondary | ICD-10-CM | POA: Diagnosis not present

## 2020-01-17 DIAGNOSIS — I10 Essential (primary) hypertension: Secondary | ICD-10-CM | POA: Diagnosis not present

## 2020-01-17 DIAGNOSIS — E782 Mixed hyperlipidemia: Secondary | ICD-10-CM | POA: Diagnosis not present

## 2020-01-17 DIAGNOSIS — J45909 Unspecified asthma, uncomplicated: Secondary | ICD-10-CM | POA: Diagnosis not present

## 2020-01-17 DIAGNOSIS — J4520 Mild intermittent asthma, uncomplicated: Secondary | ICD-10-CM | POA: Diagnosis not present

## 2020-02-19 DIAGNOSIS — J45909 Unspecified asthma, uncomplicated: Secondary | ICD-10-CM | POA: Diagnosis not present

## 2020-02-19 DIAGNOSIS — J4520 Mild intermittent asthma, uncomplicated: Secondary | ICD-10-CM | POA: Diagnosis not present

## 2020-02-19 DIAGNOSIS — I10 Essential (primary) hypertension: Secondary | ICD-10-CM | POA: Diagnosis not present

## 2020-02-19 DIAGNOSIS — E782 Mixed hyperlipidemia: Secondary | ICD-10-CM | POA: Diagnosis not present

## 2020-03-12 DIAGNOSIS — Z23 Encounter for immunization: Secondary | ICD-10-CM | POA: Diagnosis not present

## 2020-03-24 DIAGNOSIS — E782 Mixed hyperlipidemia: Secondary | ICD-10-CM | POA: Diagnosis not present

## 2020-03-24 DIAGNOSIS — J4520 Mild intermittent asthma, uncomplicated: Secondary | ICD-10-CM | POA: Diagnosis not present

## 2020-03-24 DIAGNOSIS — J45909 Unspecified asthma, uncomplicated: Secondary | ICD-10-CM | POA: Diagnosis not present

## 2020-03-24 DIAGNOSIS — I10 Essential (primary) hypertension: Secondary | ICD-10-CM | POA: Diagnosis not present

## 2020-05-12 DIAGNOSIS — N529 Male erectile dysfunction, unspecified: Secondary | ICD-10-CM | POA: Diagnosis not present

## 2020-05-12 DIAGNOSIS — I10 Essential (primary) hypertension: Secondary | ICD-10-CM | POA: Diagnosis not present

## 2020-05-12 DIAGNOSIS — K219 Gastro-esophageal reflux disease without esophagitis: Secondary | ICD-10-CM | POA: Diagnosis not present

## 2020-05-12 DIAGNOSIS — N1831 Chronic kidney disease, stage 3a: Secondary | ICD-10-CM | POA: Diagnosis not present

## 2020-07-23 DIAGNOSIS — J4520 Mild intermittent asthma, uncomplicated: Secondary | ICD-10-CM | POA: Diagnosis not present

## 2020-07-23 DIAGNOSIS — I10 Essential (primary) hypertension: Secondary | ICD-10-CM | POA: Diagnosis not present

## 2020-07-23 DIAGNOSIS — E782 Mixed hyperlipidemia: Secondary | ICD-10-CM | POA: Diagnosis not present

## 2020-07-23 DIAGNOSIS — N183 Chronic kidney disease, stage 3 unspecified: Secondary | ICD-10-CM | POA: Diagnosis not present

## 2020-08-12 DIAGNOSIS — J4520 Mild intermittent asthma, uncomplicated: Secondary | ICD-10-CM | POA: Diagnosis not present

## 2020-08-12 DIAGNOSIS — B349 Viral infection, unspecified: Secondary | ICD-10-CM | POA: Diagnosis not present

## 2020-08-14 DIAGNOSIS — B349 Viral infection, unspecified: Secondary | ICD-10-CM | POA: Diagnosis not present

## 2020-08-25 DIAGNOSIS — M545 Low back pain, unspecified: Secondary | ICD-10-CM | POA: Diagnosis not present

## 2020-08-25 DIAGNOSIS — I10 Essential (primary) hypertension: Secondary | ICD-10-CM | POA: Diagnosis not present

## 2020-08-25 DIAGNOSIS — R399 Unspecified symptoms and signs involving the genitourinary system: Secondary | ICD-10-CM | POA: Diagnosis not present

## 2020-10-21 DIAGNOSIS — I10 Essential (primary) hypertension: Secondary | ICD-10-CM | POA: Diagnosis not present

## 2020-10-21 DIAGNOSIS — J45909 Unspecified asthma, uncomplicated: Secondary | ICD-10-CM | POA: Diagnosis not present

## 2020-10-21 DIAGNOSIS — E782 Mixed hyperlipidemia: Secondary | ICD-10-CM | POA: Diagnosis not present

## 2020-10-21 DIAGNOSIS — J4520 Mild intermittent asthma, uncomplicated: Secondary | ICD-10-CM | POA: Diagnosis not present

## 2020-11-24 DIAGNOSIS — S30860A Insect bite (nonvenomous) of lower back and pelvis, initial encounter: Secondary | ICD-10-CM | POA: Diagnosis not present

## 2020-11-24 DIAGNOSIS — I10 Essential (primary) hypertension: Secondary | ICD-10-CM | POA: Diagnosis not present

## 2020-11-24 DIAGNOSIS — R7303 Prediabetes: Secondary | ICD-10-CM | POA: Diagnosis not present

## 2020-11-24 DIAGNOSIS — W57XXXA Bitten or stung by nonvenomous insect and other nonvenomous arthropods, initial encounter: Secondary | ICD-10-CM | POA: Diagnosis not present

## 2020-11-24 DIAGNOSIS — E782 Mixed hyperlipidemia: Secondary | ICD-10-CM | POA: Diagnosis not present

## 2020-11-24 DIAGNOSIS — Z0000 Encounter for general adult medical examination without abnormal findings: Secondary | ICD-10-CM | POA: Diagnosis not present

## 2020-11-27 DIAGNOSIS — I10 Essential (primary) hypertension: Secondary | ICD-10-CM | POA: Diagnosis not present

## 2020-11-27 DIAGNOSIS — J4520 Mild intermittent asthma, uncomplicated: Secondary | ICD-10-CM | POA: Diagnosis not present

## 2020-11-27 DIAGNOSIS — N1831 Chronic kidney disease, stage 3a: Secondary | ICD-10-CM | POA: Diagnosis not present

## 2020-11-27 DIAGNOSIS — E782 Mixed hyperlipidemia: Secondary | ICD-10-CM | POA: Diagnosis not present

## 2020-12-02 DIAGNOSIS — L578 Other skin changes due to chronic exposure to nonionizing radiation: Secondary | ICD-10-CM | POA: Diagnosis not present

## 2020-12-02 DIAGNOSIS — L821 Other seborrheic keratosis: Secondary | ICD-10-CM | POA: Diagnosis not present

## 2020-12-02 DIAGNOSIS — D225 Melanocytic nevi of trunk: Secondary | ICD-10-CM | POA: Diagnosis not present

## 2020-12-02 DIAGNOSIS — L814 Other melanin hyperpigmentation: Secondary | ICD-10-CM | POA: Diagnosis not present

## 2021-02-23 DIAGNOSIS — E782 Mixed hyperlipidemia: Secondary | ICD-10-CM | POA: Diagnosis not present

## 2021-02-23 DIAGNOSIS — N1831 Chronic kidney disease, stage 3a: Secondary | ICD-10-CM | POA: Diagnosis not present

## 2021-02-23 DIAGNOSIS — K219 Gastro-esophageal reflux disease without esophagitis: Secondary | ICD-10-CM | POA: Diagnosis not present

## 2021-02-23 DIAGNOSIS — I10 Essential (primary) hypertension: Secondary | ICD-10-CM | POA: Diagnosis not present

## 2021-04-02 DIAGNOSIS — Z23 Encounter for immunization: Secondary | ICD-10-CM | POA: Diagnosis not present

## 2021-05-22 DIAGNOSIS — K219 Gastro-esophageal reflux disease without esophagitis: Secondary | ICD-10-CM | POA: Diagnosis not present

## 2021-05-22 DIAGNOSIS — E782 Mixed hyperlipidemia: Secondary | ICD-10-CM | POA: Diagnosis not present

## 2021-05-22 DIAGNOSIS — G43909 Migraine, unspecified, not intractable, without status migrainosus: Secondary | ICD-10-CM | POA: Diagnosis not present

## 2021-05-22 DIAGNOSIS — N529 Male erectile dysfunction, unspecified: Secondary | ICD-10-CM | POA: Diagnosis not present

## 2021-05-22 DIAGNOSIS — I10 Essential (primary) hypertension: Secondary | ICD-10-CM | POA: Diagnosis not present

## 2021-05-22 DIAGNOSIS — R7303 Prediabetes: Secondary | ICD-10-CM | POA: Diagnosis not present

## 2021-11-27 DIAGNOSIS — Z0000 Encounter for general adult medical examination without abnormal findings: Secondary | ICD-10-CM | POA: Diagnosis not present

## 2021-11-27 DIAGNOSIS — E782 Mixed hyperlipidemia: Secondary | ICD-10-CM | POA: Diagnosis not present

## 2021-11-27 DIAGNOSIS — G43909 Migraine, unspecified, not intractable, without status migrainosus: Secondary | ICD-10-CM | POA: Diagnosis not present

## 2021-11-27 DIAGNOSIS — R7303 Prediabetes: Secondary | ICD-10-CM | POA: Diagnosis not present

## 2021-11-27 DIAGNOSIS — K219 Gastro-esophageal reflux disease without esophagitis: Secondary | ICD-10-CM | POA: Diagnosis not present

## 2021-11-27 DIAGNOSIS — I10 Essential (primary) hypertension: Secondary | ICD-10-CM | POA: Diagnosis not present

## 2021-12-02 DIAGNOSIS — D485 Neoplasm of uncertain behavior of skin: Secondary | ICD-10-CM | POA: Diagnosis not present

## 2021-12-02 DIAGNOSIS — L821 Other seborrheic keratosis: Secondary | ICD-10-CM | POA: Diagnosis not present

## 2021-12-02 DIAGNOSIS — D044 Carcinoma in situ of skin of scalp and neck: Secondary | ICD-10-CM | POA: Diagnosis not present

## 2021-12-02 DIAGNOSIS — L578 Other skin changes due to chronic exposure to nonionizing radiation: Secondary | ICD-10-CM | POA: Diagnosis not present

## 2021-12-02 DIAGNOSIS — L814 Other melanin hyperpigmentation: Secondary | ICD-10-CM | POA: Diagnosis not present

## 2021-12-02 DIAGNOSIS — D225 Melanocytic nevi of trunk: Secondary | ICD-10-CM | POA: Diagnosis not present

## 2022-01-11 DIAGNOSIS — C760 Malignant neoplasm of head, face and neck: Secondary | ICD-10-CM | POA: Diagnosis not present

## 2022-03-16 DIAGNOSIS — C44229 Squamous cell carcinoma of skin of left ear and external auricular canal: Secondary | ICD-10-CM | POA: Diagnosis not present

## 2022-04-05 DIAGNOSIS — Z23 Encounter for immunization: Secondary | ICD-10-CM | POA: Diagnosis not present

## 2022-05-17 DIAGNOSIS — Z8589 Personal history of malignant neoplasm of other organs and systems: Secondary | ICD-10-CM | POA: Diagnosis not present

## 2022-05-17 DIAGNOSIS — Z5189 Encounter for other specified aftercare: Secondary | ICD-10-CM | POA: Diagnosis not present

## 2022-05-25 DIAGNOSIS — J4520 Mild intermittent asthma, uncomplicated: Secondary | ICD-10-CM | POA: Diagnosis not present

## 2022-05-25 DIAGNOSIS — J22 Unspecified acute lower respiratory infection: Secondary | ICD-10-CM | POA: Diagnosis not present

## 2022-05-25 DIAGNOSIS — U071 COVID-19: Secondary | ICD-10-CM | POA: Diagnosis not present

## 2022-05-25 DIAGNOSIS — Z03818 Encounter for observation for suspected exposure to other biological agents ruled out: Secondary | ICD-10-CM | POA: Diagnosis not present

## 2022-05-25 DIAGNOSIS — R059 Cough, unspecified: Secondary | ICD-10-CM | POA: Diagnosis not present

## 2022-07-05 DIAGNOSIS — N529 Male erectile dysfunction, unspecified: Secondary | ICD-10-CM | POA: Diagnosis not present

## 2022-07-05 DIAGNOSIS — K219 Gastro-esophageal reflux disease without esophagitis: Secondary | ICD-10-CM | POA: Diagnosis not present

## 2022-07-05 DIAGNOSIS — R7303 Prediabetes: Secondary | ICD-10-CM | POA: Diagnosis not present

## 2022-07-05 DIAGNOSIS — G43909 Migraine, unspecified, not intractable, without status migrainosus: Secondary | ICD-10-CM | POA: Diagnosis not present

## 2022-07-05 DIAGNOSIS — E782 Mixed hyperlipidemia: Secondary | ICD-10-CM | POA: Diagnosis not present

## 2022-07-05 DIAGNOSIS — I10 Essential (primary) hypertension: Secondary | ICD-10-CM | POA: Diagnosis not present

## 2022-09-16 DIAGNOSIS — H25813 Combined forms of age-related cataract, bilateral: Secondary | ICD-10-CM | POA: Diagnosis not present

## 2022-10-07 DIAGNOSIS — H25812 Combined forms of age-related cataract, left eye: Secondary | ICD-10-CM | POA: Diagnosis not present

## 2022-10-12 DIAGNOSIS — K080 Exfoliation of teeth due to systemic causes: Secondary | ICD-10-CM | POA: Diagnosis not present

## 2022-10-26 DIAGNOSIS — H25812 Combined forms of age-related cataract, left eye: Secondary | ICD-10-CM | POA: Diagnosis not present

## 2022-10-26 DIAGNOSIS — H21562 Pupillary abnormality, left eye: Secondary | ICD-10-CM | POA: Diagnosis not present

## 2022-11-09 DIAGNOSIS — H25811 Combined forms of age-related cataract, right eye: Secondary | ICD-10-CM | POA: Diagnosis not present

## 2022-11-16 DIAGNOSIS — L570 Actinic keratosis: Secondary | ICD-10-CM | POA: Diagnosis not present

## 2022-11-16 DIAGNOSIS — D225 Melanocytic nevi of trunk: Secondary | ICD-10-CM | POA: Diagnosis not present

## 2022-11-16 DIAGNOSIS — L578 Other skin changes due to chronic exposure to nonionizing radiation: Secondary | ICD-10-CM | POA: Diagnosis not present

## 2022-11-16 DIAGNOSIS — L821 Other seborrheic keratosis: Secondary | ICD-10-CM | POA: Diagnosis not present

## 2022-11-16 DIAGNOSIS — L814 Other melanin hyperpigmentation: Secondary | ICD-10-CM | POA: Diagnosis not present

## 2022-12-16 DIAGNOSIS — K219 Gastro-esophageal reflux disease without esophagitis: Secondary | ICD-10-CM | POA: Diagnosis not present

## 2022-12-16 DIAGNOSIS — G43909 Migraine, unspecified, not intractable, without status migrainosus: Secondary | ICD-10-CM | POA: Diagnosis not present

## 2022-12-16 DIAGNOSIS — R7303 Prediabetes: Secondary | ICD-10-CM | POA: Diagnosis not present

## 2022-12-16 DIAGNOSIS — E782 Mixed hyperlipidemia: Secondary | ICD-10-CM | POA: Diagnosis not present

## 2022-12-16 DIAGNOSIS — I10 Essential (primary) hypertension: Secondary | ICD-10-CM | POA: Diagnosis not present

## 2022-12-16 DIAGNOSIS — N1831 Chronic kidney disease, stage 3a: Secondary | ICD-10-CM | POA: Diagnosis not present

## 2022-12-16 DIAGNOSIS — Z0000 Encounter for general adult medical examination without abnormal findings: Secondary | ICD-10-CM | POA: Diagnosis not present

## 2022-12-16 DIAGNOSIS — Z23 Encounter for immunization: Secondary | ICD-10-CM | POA: Diagnosis not present

## 2022-12-16 DIAGNOSIS — Z1331 Encounter for screening for depression: Secondary | ICD-10-CM | POA: Diagnosis not present

## 2023-03-23 DIAGNOSIS — Z23 Encounter for immunization: Secondary | ICD-10-CM | POA: Diagnosis not present

## 2023-05-10 DIAGNOSIS — K080 Exfoliation of teeth due to systemic causes: Secondary | ICD-10-CM | POA: Diagnosis not present

## 2023-06-06 DIAGNOSIS — B9689 Other specified bacterial agents as the cause of diseases classified elsewhere: Secondary | ICD-10-CM | POA: Diagnosis not present

## 2023-06-06 DIAGNOSIS — J069 Acute upper respiratory infection, unspecified: Secondary | ICD-10-CM | POA: Diagnosis not present

## 2023-06-21 DIAGNOSIS — G43909 Migraine, unspecified, not intractable, without status migrainosus: Secondary | ICD-10-CM | POA: Diagnosis not present

## 2023-06-21 DIAGNOSIS — N1831 Chronic kidney disease, stage 3a: Secondary | ICD-10-CM | POA: Diagnosis not present

## 2023-06-21 DIAGNOSIS — R7303 Prediabetes: Secondary | ICD-10-CM | POA: Diagnosis not present

## 2023-06-21 DIAGNOSIS — K219 Gastro-esophageal reflux disease without esophagitis: Secondary | ICD-10-CM | POA: Diagnosis not present

## 2023-06-21 DIAGNOSIS — I10 Essential (primary) hypertension: Secondary | ICD-10-CM | POA: Diagnosis not present

## 2023-06-21 DIAGNOSIS — E782 Mixed hyperlipidemia: Secondary | ICD-10-CM | POA: Diagnosis not present

## 2023-06-21 DIAGNOSIS — N529 Male erectile dysfunction, unspecified: Secondary | ICD-10-CM | POA: Diagnosis not present

## 2023-06-23 DIAGNOSIS — K080 Exfoliation of teeth due to systemic causes: Secondary | ICD-10-CM | POA: Diagnosis not present

## 2023-06-23 DIAGNOSIS — Z961 Presence of intraocular lens: Secondary | ICD-10-CM | POA: Diagnosis not present

## 2023-08-12 DIAGNOSIS — J209 Acute bronchitis, unspecified: Secondary | ICD-10-CM | POA: Diagnosis not present

## 2023-08-12 DIAGNOSIS — J029 Acute pharyngitis, unspecified: Secondary | ICD-10-CM | POA: Diagnosis not present

## 2023-08-12 DIAGNOSIS — R051 Acute cough: Secondary | ICD-10-CM | POA: Diagnosis not present

## 2023-09-27 DIAGNOSIS — L578 Other skin changes due to chronic exposure to nonionizing radiation: Secondary | ICD-10-CM | POA: Diagnosis not present

## 2023-09-27 DIAGNOSIS — L821 Other seborrheic keratosis: Secondary | ICD-10-CM | POA: Diagnosis not present

## 2023-09-27 DIAGNOSIS — L820 Inflamed seborrheic keratosis: Secondary | ICD-10-CM | POA: Diagnosis not present

## 2023-09-27 DIAGNOSIS — L570 Actinic keratosis: Secondary | ICD-10-CM | POA: Diagnosis not present

## 2023-09-27 DIAGNOSIS — D225 Melanocytic nevi of trunk: Secondary | ICD-10-CM | POA: Diagnosis not present

## 2023-09-27 DIAGNOSIS — L814 Other melanin hyperpigmentation: Secondary | ICD-10-CM | POA: Diagnosis not present

## 2023-11-01 DIAGNOSIS — K080 Exfoliation of teeth due to systemic causes: Secondary | ICD-10-CM | POA: Diagnosis not present

## 2023-11-24 DIAGNOSIS — I10 Essential (primary) hypertension: Secondary | ICD-10-CM | POA: Diagnosis not present

## 2023-11-24 DIAGNOSIS — N1831 Chronic kidney disease, stage 3a: Secondary | ICD-10-CM | POA: Diagnosis not present

## 2023-12-05 DIAGNOSIS — J4520 Mild intermittent asthma, uncomplicated: Secondary | ICD-10-CM | POA: Diagnosis not present

## 2023-12-05 DIAGNOSIS — N1831 Chronic kidney disease, stage 3a: Secondary | ICD-10-CM | POA: Diagnosis not present

## 2023-12-05 DIAGNOSIS — E782 Mixed hyperlipidemia: Secondary | ICD-10-CM | POA: Diagnosis not present

## 2023-12-05 DIAGNOSIS — M1712 Unilateral primary osteoarthritis, left knee: Secondary | ICD-10-CM | POA: Diagnosis not present

## 2023-12-23 DIAGNOSIS — N1831 Chronic kidney disease, stage 3a: Secondary | ICD-10-CM | POA: Diagnosis not present

## 2023-12-23 DIAGNOSIS — I10 Essential (primary) hypertension: Secondary | ICD-10-CM | POA: Diagnosis not present

## 2024-01-05 DIAGNOSIS — J4520 Mild intermittent asthma, uncomplicated: Secondary | ICD-10-CM | POA: Diagnosis not present

## 2024-01-05 DIAGNOSIS — M1712 Unilateral primary osteoarthritis, left knee: Secondary | ICD-10-CM | POA: Diagnosis not present

## 2024-01-05 DIAGNOSIS — N1831 Chronic kidney disease, stage 3a: Secondary | ICD-10-CM | POA: Diagnosis not present

## 2024-01-05 DIAGNOSIS — E782 Mixed hyperlipidemia: Secondary | ICD-10-CM | POA: Diagnosis not present

## 2024-01-05 DIAGNOSIS — I10 Essential (primary) hypertension: Secondary | ICD-10-CM | POA: Diagnosis not present

## 2024-01-11 DIAGNOSIS — K080 Exfoliation of teeth due to systemic causes: Secondary | ICD-10-CM | POA: Diagnosis not present

## 2024-01-22 DIAGNOSIS — I10 Essential (primary) hypertension: Secondary | ICD-10-CM | POA: Diagnosis not present

## 2024-01-22 DIAGNOSIS — N1831 Chronic kidney disease, stage 3a: Secondary | ICD-10-CM | POA: Diagnosis not present

## 2024-01-30 DIAGNOSIS — R7303 Prediabetes: Secondary | ICD-10-CM | POA: Diagnosis not present

## 2024-01-30 DIAGNOSIS — I10 Essential (primary) hypertension: Secondary | ICD-10-CM | POA: Diagnosis not present

## 2024-01-30 DIAGNOSIS — Z1331 Encounter for screening for depression: Secondary | ICD-10-CM | POA: Diagnosis not present

## 2024-01-30 DIAGNOSIS — E782 Mixed hyperlipidemia: Secondary | ICD-10-CM | POA: Diagnosis not present

## 2024-01-30 DIAGNOSIS — Z0000 Encounter for general adult medical examination without abnormal findings: Secondary | ICD-10-CM | POA: Diagnosis not present

## 2024-01-30 DIAGNOSIS — N1831 Chronic kidney disease, stage 3a: Secondary | ICD-10-CM | POA: Diagnosis not present

## 2024-01-30 DIAGNOSIS — K219 Gastro-esophageal reflux disease without esophagitis: Secondary | ICD-10-CM | POA: Diagnosis not present

## 2024-01-30 DIAGNOSIS — G43909 Migraine, unspecified, not intractable, without status migrainosus: Secondary | ICD-10-CM | POA: Diagnosis not present

## 2024-02-05 DIAGNOSIS — I10 Essential (primary) hypertension: Secondary | ICD-10-CM | POA: Diagnosis not present

## 2024-02-05 DIAGNOSIS — M1712 Unilateral primary osteoarthritis, left knee: Secondary | ICD-10-CM | POA: Diagnosis not present

## 2024-02-05 DIAGNOSIS — E782 Mixed hyperlipidemia: Secondary | ICD-10-CM | POA: Diagnosis not present

## 2024-02-05 DIAGNOSIS — N1831 Chronic kidney disease, stage 3a: Secondary | ICD-10-CM | POA: Diagnosis not present

## 2024-02-05 DIAGNOSIS — J4520 Mild intermittent asthma, uncomplicated: Secondary | ICD-10-CM | POA: Diagnosis not present

## 2024-02-21 DIAGNOSIS — I10 Essential (primary) hypertension: Secondary | ICD-10-CM | POA: Diagnosis not present

## 2024-02-21 DIAGNOSIS — N1831 Chronic kidney disease, stage 3a: Secondary | ICD-10-CM | POA: Diagnosis not present

## 2024-03-06 DIAGNOSIS — E782 Mixed hyperlipidemia: Secondary | ICD-10-CM | POA: Diagnosis not present

## 2024-03-06 DIAGNOSIS — M1712 Unilateral primary osteoarthritis, left knee: Secondary | ICD-10-CM | POA: Diagnosis not present

## 2024-03-06 DIAGNOSIS — N1831 Chronic kidney disease, stage 3a: Secondary | ICD-10-CM | POA: Diagnosis not present

## 2024-03-06 DIAGNOSIS — I10 Essential (primary) hypertension: Secondary | ICD-10-CM | POA: Diagnosis not present

## 2024-03-06 DIAGNOSIS — J4520 Mild intermittent asthma, uncomplicated: Secondary | ICD-10-CM | POA: Diagnosis not present

## 2024-03-22 DIAGNOSIS — I10 Essential (primary) hypertension: Secondary | ICD-10-CM | POA: Diagnosis not present

## 2024-03-22 DIAGNOSIS — N1831 Chronic kidney disease, stage 3a: Secondary | ICD-10-CM | POA: Diagnosis not present

## 2024-03-28 DIAGNOSIS — Z23 Encounter for immunization: Secondary | ICD-10-CM | POA: Diagnosis not present

## 2024-04-06 DIAGNOSIS — M1712 Unilateral primary osteoarthritis, left knee: Secondary | ICD-10-CM | POA: Diagnosis not present

## 2024-04-06 DIAGNOSIS — I10 Essential (primary) hypertension: Secondary | ICD-10-CM | POA: Diagnosis not present

## 2024-04-06 DIAGNOSIS — E782 Mixed hyperlipidemia: Secondary | ICD-10-CM | POA: Diagnosis not present

## 2024-04-06 DIAGNOSIS — J4520 Mild intermittent asthma, uncomplicated: Secondary | ICD-10-CM | POA: Diagnosis not present

## 2024-04-06 DIAGNOSIS — N1831 Chronic kidney disease, stage 3a: Secondary | ICD-10-CM | POA: Diagnosis not present

## 2024-04-21 DIAGNOSIS — I10 Essential (primary) hypertension: Secondary | ICD-10-CM | POA: Diagnosis not present

## 2024-04-21 DIAGNOSIS — N1831 Chronic kidney disease, stage 3a: Secondary | ICD-10-CM | POA: Diagnosis not present

## 2024-05-06 DIAGNOSIS — I10 Essential (primary) hypertension: Secondary | ICD-10-CM | POA: Diagnosis not present

## 2024-05-06 DIAGNOSIS — N1831 Chronic kidney disease, stage 3a: Secondary | ICD-10-CM | POA: Diagnosis not present

## 2024-05-06 DIAGNOSIS — E782 Mixed hyperlipidemia: Secondary | ICD-10-CM | POA: Diagnosis not present

## 2024-05-06 DIAGNOSIS — J4520 Mild intermittent asthma, uncomplicated: Secondary | ICD-10-CM | POA: Diagnosis not present

## 2024-05-06 DIAGNOSIS — M1712 Unilateral primary osteoarthritis, left knee: Secondary | ICD-10-CM | POA: Diagnosis not present

## 2024-05-09 DIAGNOSIS — K080 Exfoliation of teeth due to systemic causes: Secondary | ICD-10-CM | POA: Diagnosis not present
